# Patient Record
Sex: Male | Born: 1960 | Race: Black or African American | Hispanic: No | Marital: Single | Smoking: Current every day smoker
Health system: Southern US, Community
[De-identification: ages and names within clinical notes are randomized; demographics above are authoritative.]

## PROBLEM LIST (undated history)

## (undated) DIAGNOSIS — E119 Type 2 diabetes mellitus without complications: Secondary | ICD-10-CM

## (undated) DIAGNOSIS — I10 Essential (primary) hypertension: Secondary | ICD-10-CM

## (undated) HISTORY — PX: HERNIA REPAIR: SHX51

---

## 2017-03-07 ENCOUNTER — Emergency Department (HOSPITAL_COMMUNITY): Payer: Self-pay

## 2017-03-07 ENCOUNTER — Encounter (HOSPITAL_COMMUNITY): Payer: Self-pay | Admitting: Emergency Medicine

## 2017-03-07 ENCOUNTER — Emergency Department (HOSPITAL_COMMUNITY)
Admission: EM | Admit: 2017-03-07 | Discharge: 2017-03-08 | Disposition: A | Discharge status: Home / Self Care | Payer: Self-pay | Attending: Emergency Medicine | Admitting: Emergency Medicine

## 2017-03-07 DIAGNOSIS — Y939 Activity, unspecified: Secondary | ICD-10-CM | POA: Insufficient documentation

## 2017-03-07 DIAGNOSIS — S71012A Laceration without foreign body, left hip, initial encounter: Secondary | ICD-10-CM | POA: Insufficient documentation

## 2017-03-07 DIAGNOSIS — W3400XA Accidental discharge from unspecified firearms or gun, initial encounter: Secondary | ICD-10-CM | POA: Insufficient documentation

## 2017-03-07 DIAGNOSIS — Y929 Unspecified place or not applicable: Secondary | ICD-10-CM | POA: Insufficient documentation

## 2017-03-07 DIAGNOSIS — E119 Type 2 diabetes mellitus without complications: Secondary | ICD-10-CM | POA: Insufficient documentation

## 2017-03-07 DIAGNOSIS — I10 Essential (primary) hypertension: Secondary | ICD-10-CM | POA: Insufficient documentation

## 2017-03-07 DIAGNOSIS — Y999 Unspecified external cause status: Secondary | ICD-10-CM | POA: Insufficient documentation

## 2017-03-07 DIAGNOSIS — F17200 Nicotine dependence, unspecified, uncomplicated: Secondary | ICD-10-CM | POA: Insufficient documentation

## 2017-03-07 HISTORY — DX: Essential (primary) hypertension: I10

## 2017-03-07 HISTORY — DX: Type 2 diabetes mellitus without complications: E11.9

## 2017-03-07 LAB — I-STAT CHEM 8, ED
BUN: 21 mg/dL — ABNORMAL HIGH (ref 6–20)
CALCIUM ION: 1.14 mmol/L — AB (ref 1.15–1.40)
CREATININE: 1.6 mg/dL — AB (ref 0.61–1.24)
Chloride: 107 mmol/L (ref 101–111)
GLUCOSE: 201 mg/dL — AB (ref 65–99)
HCT: 36 % — ABNORMAL LOW (ref 39.0–52.0)
HEMOGLOBIN: 12.2 g/dL — AB (ref 13.0–17.0)
Potassium: 3.6 mmol/L (ref 3.5–5.1)
Sodium: 141 mmol/L (ref 135–145)
TCO2: 24 mmol/L (ref 22–32)

## 2017-03-07 MED ORDER — IOPAMIDOL (ISOVUE-300) INJECTION 61%
INTRAVENOUS | Status: AC
  Administered 2017-03-07: 100 mL
  Filled 2017-03-07: qty 100
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
## 2019-05-06 ENCOUNTER — Other Ambulatory Visit: Payer: Self-pay

## 2019-05-06 ENCOUNTER — Encounter (HOSPITAL_BASED_OUTPATIENT_CLINIC_OR_DEPARTMENT_OTHER): Payer: Self-pay | Admitting: Emergency Medicine

## 2019-05-06 ENCOUNTER — Emergency Department (HOSPITAL_BASED_OUTPATIENT_CLINIC_OR_DEPARTMENT_OTHER)
Admission: EM | Admit: 2019-05-06 | Discharge: 2019-05-06 | Disposition: A | Discharge status: Home / Self Care | Payer: Self-pay | Source: Home / Self Care | Attending: Emergency Medicine | Admitting: Emergency Medicine

## 2019-05-06 ENCOUNTER — Emergency Department (HOSPITAL_BASED_OUTPATIENT_CLINIC_OR_DEPARTMENT_OTHER): Payer: Self-pay

## 2019-05-06 ENCOUNTER — Emergency Department (HOSPITAL_BASED_OUTPATIENT_CLINIC_OR_DEPARTMENT_OTHER)
Admission: EM | Admit: 2019-05-06 | Discharge: 2019-05-06 | Disposition: A | Discharge status: Home / Self Care | Payer: Self-pay | Attending: Emergency Medicine | Admitting: Emergency Medicine

## 2019-05-06 DIAGNOSIS — E119 Type 2 diabetes mellitus without complications: Secondary | ICD-10-CM | POA: Insufficient documentation

## 2019-05-06 DIAGNOSIS — Z20822 Contact with and (suspected) exposure to covid-19: Secondary | ICD-10-CM | POA: Insufficient documentation

## 2019-05-06 DIAGNOSIS — E1122 Type 2 diabetes mellitus with diabetic chronic kidney disease: Secondary | ICD-10-CM

## 2019-05-06 DIAGNOSIS — N189 Chronic kidney disease, unspecified: Secondary | ICD-10-CM

## 2019-05-06 DIAGNOSIS — F17210 Nicotine dependence, cigarettes, uncomplicated: Secondary | ICD-10-CM | POA: Insufficient documentation

## 2019-05-06 DIAGNOSIS — I1310 Hypertensive heart and chronic kidney disease without heart failure, with stage 1 through stage 4 chronic kidney disease, or unspecified chronic kidney disease: Secondary | ICD-10-CM | POA: Insufficient documentation

## 2019-05-06 DIAGNOSIS — I10 Essential (primary) hypertension: Secondary | ICD-10-CM

## 2019-05-06 LAB — URINALYSIS, ROUTINE W REFLEX MICROSCOPIC
Bilirubin Urine: NEGATIVE
Glucose, UA: NEGATIVE mg/dL
Ketones, ur: 15 mg/dL — AB
Leukocytes,Ua: NEGATIVE
Nitrite: NEGATIVE
Protein, ur: >300 mg/dL — AB
Specific Gravity, Urine: >1.03 — ABNORMAL HIGH (ref 1.005–1.030)
pH: 5.5 (ref 5.0–8.0)

## 2019-05-06 LAB — CBC WITH DIFFERENTIAL/PLATELET
Abs Immature Granulocytes: 0.05 10*3/uL (ref 0.00–0.07)
Basophils Absolute: 0 10*3/uL (ref 0.0–0.1)
Basophils Relative: 0 %
Eosinophils Absolute: 0 10*3/uL (ref 0.0–0.5)
Eosinophils Relative: 0 %
HCT: 35.3 % — ABNORMAL LOW (ref 39.0–52.0)
Hemoglobin: 11.8 g/dL — ABNORMAL LOW (ref 13.0–17.0)
Immature Granulocytes: 1 %
Lymphocytes Relative: 10 %
Lymphs Abs: 0.9 10*3/uL (ref 0.7–4.0)
MCH: 32.3 pg (ref 26.0–34.0)
MCHC: 33.4 g/dL (ref 30.0–36.0)
MCV: 96.7 fL (ref 80.0–100.0)
Monocytes Absolute: 0.9 10*3/uL (ref 0.1–1.0)
Monocytes Relative: 9 %
Neutro Abs: 7.8 10*3/uL — ABNORMAL HIGH (ref 1.7–7.7)
Neutrophils Relative %: 80 %
Platelets: 185 10*3/uL (ref 150–400)
RBC: 3.65 MIL/uL — ABNORMAL LOW (ref 4.22–5.81)
RDW: 13.6 % (ref 11.5–15.5)
WBC: 9.7 10*3/uL (ref 4.0–10.5)
nRBC: 0 % (ref 0.0–0.2)

## 2019-05-06 LAB — COMPREHENSIVE METABOLIC PANEL
ALT: 39 U/L (ref 0–44)
AST: 75 U/L — ABNORMAL HIGH (ref 15–41)
Albumin: 3.7 g/dL (ref 3.5–5.0)
Alkaline Phosphatase: 48 U/L (ref 38–126)
Anion gap: 16 — ABNORMAL HIGH (ref 5–15)
BUN: 17 mg/dL (ref 8–23)
CO2: 22 mmol/L (ref 22–32)
Calcium: 9.2 mg/dL (ref 8.9–10.3)
Chloride: 96 mmol/L — ABNORMAL LOW (ref 98–111)
Creatinine, Ser: 2.15 mg/dL — ABNORMAL HIGH (ref 0.61–1.24)
GFR calc Af Amer: 37 mL/min — ABNORMAL LOW (ref >60)
GFR calc non Af Amer: 32 mL/min — ABNORMAL LOW (ref >60)
Glucose, Bld: 195 mg/dL — ABNORMAL HIGH (ref 70–99)
Potassium: 3.6 mmol/L (ref 3.5–5.1)
Sodium: 134 mmol/L — ABNORMAL LOW (ref 135–145)
Total Bilirubin: 0.8 mg/dL (ref 0.3–1.2)
Total Protein: 8.4 g/dL — ABNORMAL HIGH (ref 6.5–8.1)

## 2019-05-06 LAB — URINALYSIS, MICROSCOPIC (REFLEX)

## 2019-05-06 LAB — SARS CORONAVIRUS 2 (TAT 6-24 HRS): SARS Coronavirus 2: NEGATIVE

## 2019-05-06 LAB — LACTIC ACID, PLASMA: Lactic Acid, Venous: 1.8 mmol/L (ref 0.5–1.9)

## 2019-05-06 LAB — SARS CORONAVIRUS 2 AG (30 MIN TAT): SARS Coronavirus 2 Ag: NEGATIVE

## 2019-05-06 MED ORDER — LACTATED RINGERS IV BOLUS
1000.0000 mL | Freq: Once | INTRAVENOUS | Status: AC
  Administered 2019-05-06: 08:00:00 1000 mL via INTRAVENOUS

## 2019-05-06 MED ORDER — ACETAMINOPHEN 500 MG PO TABS
1000.0000 mg | ORAL_TABLET | Freq: Once | ORAL | Status: AC | PRN
  Administered 2019-05-06: 08:00:00 1000 mg via ORAL
  Filled 2019-05-06: qty 2

## 2019-05-06 NOTE — Progress Notes (Signed)
05/06/2019 12:34 pm TOC CM referral received to arrange appt with PCP. Attempted call to pt and left HIPAA compliant message for a return call. ED provider updated. Isidoro Donning RN CCM, [facility] ED TOC CM [phone]

## 2019-05-06 NOTE — ED Triage Notes (Signed)
Pt reports generalized weakness, fatigue, body aches, subjective fever since Tuesday.

## 2019-05-06 NOTE — ED Notes (Signed)
Pt reports he has not been taking BP meds and diabetes meds for years.

## 2019-05-06 NOTE — ED Provider Notes (Signed)
[place] EMERGENCY DEPARTMENT Provider Note   CSN: [id number] Arrival date & time: 05/06/19  0802     History Chief Complaint  Patient presents with  . Weakness    Brent Thompson is a 59 y.o. male.  The history is provided by the patient.  URI Presenting symptoms: congestion, cough, fatigue and fever   Severity:  Severe Onset quality:  Gradual Duration:  3 days Timing:  Constant Progression:  Worsening Chronicity:  New Relieved by:  Rest Worsened by:  Nothing Ineffective treatments:  None tried Associated symptoms: myalgias   Associated symptoms comment:  Abd cramping, diarrhea up to 8 times a day, no nausea or vomiting, poor appetite.   Risk factors: diabetes mellitus   Risk factors comment:  History of diabetes and hypertension but has been off meds for years and is not interested in taking medication.  He has not had Covid that he is aware of he has not had the vaccine and has had no recent contacts that are Covid positive      Past Medical History:  Diagnosis Date  . Diabetes mellitus without complication ([place])   . Hypertension     There are no problems to display for this patient.   Past Surgical History:  Procedure Laterality Date  . HERNIA REPAIR         No family history on file.  Social History   Tobacco Use  . Smoking status: Current Every Day Smoker    Packs/day: 1.00  . Smokeless tobacco: Never Used  Substance Use Topics  . Alcohol use: Yes    Comment: daily  . Drug use: No    Home Medications Prior to Admission medications   Not on File    Allergies    Patient has no known allergies.  Review of Systems   Review of Systems  Constitutional: Positive for fatigue and fever.  HENT: Positive for congestion.   Respiratory: Positive for cough.   Musculoskeletal: Positive for myalgias.  All other systems reviewed and are negative.   Physical Exam Updated Vital Signs BP (!) 222/138 (BP Location: Right Arm)   Pulse (!)  121   Temp (!) 103.1 F (39.5 C) (Oral)   Resp (!) 32   Ht 5\' 6"  (1.676 m)   Wt 72.6 kg   SpO2 97%   BMI 25.82 kg/m   Physical Exam Vitals and nursing note reviewed.  Constitutional:      General: He is not in acute distress.    Appearance: He is well-developed and normal weight.  HENT:     Head: Normocephalic and atraumatic.     Mouth/Throat:     Mouth: Mucous membranes are dry.  Eyes:     Conjunctiva/sclera: Conjunctivae normal.     Pupils: Pupils are equal, round, and reactive to light.  Cardiovascular:     Rate and Rhythm: Regular rhythm. Tachycardia present.     Pulses: Normal pulses.     Heart sounds: No murmur.  Pulmonary:     Effort: Pulmonary effort is normal. No respiratory distress.     Breath sounds: Normal breath sounds. No wheezing or rales.  Abdominal:     General: There is no distension.     Palpations: Abdomen is soft.     Tenderness: There is no abdominal tenderness. There is no guarding or rebound.  Musculoskeletal:        General: No tenderness. Normal range of motion.     Cervical back: Normal range of motion and  neck supple.     Right lower leg: No edema.     Left lower leg: No edema.  Skin:    General: Skin is warm and dry.     Findings: No erythema or rash.  Neurological:     General: No focal deficit present.     Mental Status: He is alert and oriented to person, place, and time. Mental status is at baseline.  Psychiatric:        Mood and Affect: Mood normal.        Behavior: Behavior normal.        Thought Content: Thought content normal.      ED Results / Procedures / Treatments   Labs (all labs ordered are listed, but only abnormal results are displayed) Labs Reviewed  COMPREHENSIVE METABOLIC PANEL - Abnormal; Notable for the following components:      Result Value   Sodium 134 (*)    Chloride 96 (*)    Glucose, Bld 195 (*)    Creatinine, Ser 2.15 (*)    Total Protein 8.4 (*)    AST 75 (*)    GFR calc non Af Amer 32 (*)     GFR calc Af Amer 37 (*)    Anion gap 16 (*)    All other components within normal limits  CBC WITH DIFFERENTIAL/PLATELET - Abnormal; Notable for the following components:   RBC 3.65 (*)    Hemoglobin 11.8 (*)    HCT 35.3 (*)    Neutro Abs 7.8 (*)    All other components within normal limits  SARS CORONAVIRUS 2 AG (30 MIN TAT)  LACTIC ACID, PLASMA  LACTIC ACID, PLASMA  URINALYSIS, ROUTINE W REFLEX MICROSCOPIC    EKG EKG Interpretation  Date/Time:  Thursday May 06 2019 08:16:41 EDT Ventricular Rate:  121 PR Interval:    QRS Duration: 94 QT Interval:  340 QTC Calculation: 483 R Axis:   77 Text Interpretation: Sinus tachycardia LAE, consider biatrial enlargement Probable left ventricular hypertrophy Borderline prolonged QT interval No significant change since last tracing Confirmed by Gwyneth Sprout ([id number]) on 05/06/2019 8:25:50 AM   Radiology DG Chest Portable 1 View  Result Date: 05/06/2019 CLINICAL DATA:  Fever EXAM: PORTABLE CHEST 1 VIEW COMPARISON:  None. FINDINGS: Normal heart size and mediastinal contours. No acute infiltrate or edema. No effusion or pneumothorax. No acute osseous findings. IMPRESSION: Negative chest. Electronically Signed   By: Marnee Spring M.D.   On: 05/06/2019 08:53    Procedures Procedures (including critical care time)  Medications Ordered in ED Medications  lactated ringers bolus 1,000 mL (has no administration in time range)  acetaminophen (TYLENOL) tablet 1,000 mg (1,000 mg Oral Given 05/06/19 0825)    ED Course  I have reviewed the triage vital signs and the nursing notes.  Pertinent labs & imaging results that were available during my care of the patient were reviewed by me and considered in my medical decision making (see chart for details).    MDM Rules/Calculators/A&P                      Patient is a 59 year old male presenting today with viral syndrome with fever, myalgias, cough and diarrhea.  Patient denies any shortness of  breath or chest pain.  Patient is noted here to be extremely hypertensive and tachycardic with a fever of 103.1.  Patient reports that he has a known history of hypertension but has not taken medication for at least 2 or  3 years and is not interested in taking medications.  He denies any chest pain or vomiting.  He has no localized abdominal pain concerning for an acute abdominal process.  Suspect most likely Covid or other viral process.  Given patient's poor medical follow-up will check to ensure he has reasonable kidney function and stable labs.  Chest x-ray is pending.  Patient given IV fluids and Tylenol for fever.  Covid test pending.  EKG shows sinus tachycardia with LVH which is not significantly changed from prior tracings.  We will continue to follow blood pressure.  9:12 AM Rapid Covid is negative and will send 6 to 12-hour testing, lactic acid within normal limits, CMP with hyperglycemia of 195 and creatinine of 2.15 which last creatinine that was done was 2 years ago it was 1.6.  This may be his baseline from chronic hypertension it is unknown.  BUN is normal.  Mild anion gap of 16.  Chest x-ray within normal limits and CBC without acute findings.  After Tylenol and IV fluids patient's heart rate has improved to the upper 90's and low 100s with oxygen saturation remaining 98% and blood pressure 192/111. Also patient does not have insurance and will consult transitional care team for help with establishing an appointment with a PCP.  At this time patient does not require any further intervention and is safe for discharge.  MDM Number of Diagnoses or Management Options   Amount and/or Complexity of Data Reviewed Clinical lab tests: ordered and reviewed Tests in the radiology section of CPT: ordered and reviewed Tests in the medicine section of CPT: ordered and reviewed Decide to obtain previous medical records or to obtain history from someone other than the patient: yes Obtain history  from someone other than the patient: no Review and summarize past medical records: yes Independent visualization of images, tracings, or specimens: yes  Risk of Complications, Morbidity, and/or Mortality Presenting problems: moderate Diagnostic procedures: minimal Management options: minimal  Patient Progress Patient progress: improved   Brent Thompson was evaluated in Emergency Department on 05/06/2019 for the symptoms described in the history of present illness. He was evaluated in the context of the global COVID-19 pandemic, which necessitated consideration that the patient might be at risk for infection with the SARS-CoV-2 virus that causes COVID-19. Institutional protocols and algorithms that pertain to the evaluation of patients at risk for COVID-19 are in a state of rapid change based on information released by regulatory bodies including the CDC and federal and state organizations. These policies and algorithms were followed during the patient's care in the ED.  Final Clinical Impression(s) / ED Diagnoses Final diagnoses:  Suspected COVID-19 virus infection  Hypertension, unspecified type  Chronic kidney disease, unspecified CKD stage    Rx / DC Orders ED Discharge Orders    None       Gwyneth Sprout, MD 05/06/19 [phone]

## 2019-05-10 ENCOUNTER — Emergency Department (HOSPITAL_BASED_OUTPATIENT_CLINIC_OR_DEPARTMENT_OTHER): Payer: Self-pay

## 2019-05-10 ENCOUNTER — Inpatient Hospital Stay (HOSPITAL_BASED_OUTPATIENT_CLINIC_OR_DEPARTMENT_OTHER)
Admission: EM | Admit: 2019-05-10 | Discharge: 2019-05-13 | DRG: 871 | Disposition: A | Discharge status: Home / Self Care | Payer: Self-pay | Attending: Internal Medicine | Admitting: Internal Medicine

## 2019-05-10 ENCOUNTER — Other Ambulatory Visit: Payer: Self-pay

## 2019-05-10 ENCOUNTER — Encounter (HOSPITAL_BASED_OUTPATIENT_CLINIC_OR_DEPARTMENT_OTHER): Payer: Self-pay

## 2019-05-10 DIAGNOSIS — N183 Chronic kidney disease, stage 3 unspecified: Secondary | ICD-10-CM

## 2019-05-10 DIAGNOSIS — E872 Acidosis: Secondary | ICD-10-CM | POA: Diagnosis present

## 2019-05-10 DIAGNOSIS — R6520 Severe sepsis without septic shock: Secondary | ICD-10-CM | POA: Diagnosis present

## 2019-05-10 DIAGNOSIS — I10 Essential (primary) hypertension: Secondary | ICD-10-CM

## 2019-05-10 DIAGNOSIS — E119 Type 2 diabetes mellitus without complications: Secondary | ICD-10-CM

## 2019-05-10 DIAGNOSIS — F17210 Nicotine dependence, cigarettes, uncomplicated: Secondary | ICD-10-CM | POA: Diagnosis present

## 2019-05-10 DIAGNOSIS — A419 Sepsis, unspecified organism: Principal | ICD-10-CM | POA: Diagnosis present

## 2019-05-10 DIAGNOSIS — J189 Pneumonia, unspecified organism: Secondary | ICD-10-CM | POA: Diagnosis present

## 2019-05-10 DIAGNOSIS — E871 Hypo-osmolality and hyponatremia: Secondary | ICD-10-CM | POA: Diagnosis present

## 2019-05-10 DIAGNOSIS — Z20822 Contact with and (suspected) exposure to covid-19: Secondary | ICD-10-CM | POA: Diagnosis present

## 2019-05-10 DIAGNOSIS — E1122 Type 2 diabetes mellitus with diabetic chronic kidney disease: Secondary | ICD-10-CM

## 2019-05-10 DIAGNOSIS — N179 Acute kidney failure, unspecified: Secondary | ICD-10-CM

## 2019-05-10 DIAGNOSIS — N1832 Chronic kidney disease, stage 3b: Secondary | ICD-10-CM | POA: Diagnosis present

## 2019-05-10 DIAGNOSIS — E1165 Type 2 diabetes mellitus with hyperglycemia: Secondary | ICD-10-CM | POA: Diagnosis present

## 2019-05-10 DIAGNOSIS — E876 Hypokalemia: Secondary | ICD-10-CM

## 2019-05-10 DIAGNOSIS — R739 Hyperglycemia, unspecified: Secondary | ICD-10-CM

## 2019-05-10 DIAGNOSIS — E878 Other disorders of electrolyte and fluid balance, not elsewhere classified: Secondary | ICD-10-CM | POA: Diagnosis present

## 2019-05-10 HISTORY — DX: Type 2 diabetes mellitus without complications: E11.9

## 2019-05-10 HISTORY — DX: Essential (primary) hypertension: I10

## 2019-05-10 LAB — COMPREHENSIVE METABOLIC PANEL
ALT: 85 U/L — ABNORMAL HIGH (ref 0–44)
AST: 170 U/L — ABNORMAL HIGH (ref 15–41)
Albumin: 3.3 g/dL — ABNORMAL LOW (ref 3.5–5.0)
Alkaline Phosphatase: 68 U/L (ref 38–126)
Anion gap: 17 — ABNORMAL HIGH (ref 5–15)
BUN: 28 mg/dL — ABNORMAL HIGH (ref 8–23)
CO2: 22 mmol/L (ref 22–32)
Calcium: 9.3 mg/dL (ref 8.9–10.3)
Chloride: 90 mmol/L — ABNORMAL LOW (ref 98–111)
Creatinine, Ser: 2.62 mg/dL — ABNORMAL HIGH (ref 0.61–1.24)
GFR calc Af Amer: 29 mL/min — ABNORMAL LOW (ref >60)
GFR calc non Af Amer: 25 mL/min — ABNORMAL LOW (ref >60)
Glucose, Bld: 337 mg/dL — ABNORMAL HIGH (ref 70–99)
Potassium: 3.2 mmol/L — ABNORMAL LOW (ref 3.5–5.1)
Sodium: 129 mmol/L — ABNORMAL LOW (ref 135–145)
Total Bilirubin: 0.9 mg/dL (ref 0.3–1.2)
Total Protein: 8.5 g/dL — ABNORMAL HIGH (ref 6.5–8.1)

## 2019-05-10 LAB — URINALYSIS, ROUTINE W REFLEX MICROSCOPIC
Bilirubin Urine: NEGATIVE
Glucose, UA: >=500 mg/dL — AB
Ketones, ur: 15 mg/dL — AB
Leukocytes,Ua: NEGATIVE
Nitrite: NEGATIVE
Protein, ur: >300 mg/dL — AB
Specific Gravity, Urine: >1.03 — ABNORMAL HIGH (ref 1.005–1.030)
pH: 6 (ref 5.0–8.0)

## 2019-05-10 LAB — URINALYSIS, MICROSCOPIC (REFLEX)

## 2019-05-10 LAB — CBC WITH DIFFERENTIAL/PLATELET
Abs Immature Granulocytes: 0.11 10*3/uL — ABNORMAL HIGH (ref 0.00–0.07)
Basophils Absolute: 0 10*3/uL (ref 0.0–0.1)
Basophils Relative: 0 %
Eosinophils Absolute: 0 10*3/uL (ref 0.0–0.5)
Eosinophils Relative: 0 %
HCT: 34.8 % — ABNORMAL LOW (ref 39.0–52.0)
Hemoglobin: 11.8 g/dL — ABNORMAL LOW (ref 13.0–17.0)
Immature Granulocytes: 1 %
Lymphocytes Relative: 6 %
Lymphs Abs: 0.6 10*3/uL — ABNORMAL LOW (ref 0.7–4.0)
MCH: 32.1 pg (ref 26.0–34.0)
MCHC: 33.9 g/dL (ref 30.0–36.0)
MCV: 94.6 fL (ref 80.0–100.0)
Monocytes Absolute: 0.7 10*3/uL (ref 0.1–1.0)
Monocytes Relative: 7 %
Neutro Abs: 8.1 10*3/uL — ABNORMAL HIGH (ref 1.7–7.7)
Neutrophils Relative %: 86 %
Platelets: 197 10*3/uL (ref 150–400)
RBC: 3.68 MIL/uL — ABNORMAL LOW (ref 4.22–5.81)
RDW: 14 % (ref 11.5–15.5)
WBC: 9.4 10*3/uL (ref 4.0–10.5)
nRBC: 0 % (ref 0.0–0.2)

## 2019-05-10 LAB — LACTIC ACID, PLASMA
Lactic Acid, Venous: 2.5 mmol/L (ref 0.5–1.9)
Lactic Acid, Venous: 1.5 mmol/L (ref 0.5–1.9)

## 2019-05-10 LAB — HIV ANTIBODY (ROUTINE TESTING W REFLEX): HIV Screen 4th Generation wRfx: NONREACTIVE

## 2019-05-10 LAB — APTT: aPTT: 41 seconds — ABNORMAL HIGH (ref 24–36)

## 2019-05-10 LAB — SARS CORONAVIRUS 2 (TAT 6-24 HRS): SARS Coronavirus 2: NEGATIVE

## 2019-05-10 LAB — PROTIME-INR
INR: 1 (ref 0.8–1.2)
Prothrombin Time: 13.3 seconds (ref 11.4–15.2)

## 2019-05-10 MED ORDER — POTASSIUM CHLORIDE CRYS ER 20 MEQ PO TBCR
40.0000 meq | EXTENDED_RELEASE_TABLET | ORAL | Status: AC
  Administered 2019-05-10 – 2019-05-11 (×2): 40 meq via ORAL
  Filled 2019-05-10 (×2): qty 2

## 2019-05-10 MED ORDER — SODIUM CHLORIDE 0.9 % IV SOLN: INTRAVENOUS | Status: DC

## 2019-05-10 MED ORDER — PANTOPRAZOLE SODIUM 40 MG PO TBEC
40.0000 mg | DELAYED_RELEASE_TABLET | Freq: Every day | ORAL | Status: DC
  Administered 2019-05-10 – 2019-05-13 (×4): 40 mg via ORAL
  Filled 2019-05-10 (×4): qty 1

## 2019-05-10 MED ORDER — POLYETHYLENE GLYCOL 3350 17 G PO PACK
17.0000 g | PACK | Freq: Two times a day (BID) | ORAL | Status: DC
  Administered 2019-05-10 – 2019-05-13 (×5): 17 g via ORAL
  Filled 2019-05-10 (×6): qty 1

## 2019-05-10 MED ORDER — SODIUM CHLORIDE 0.9 % IV SOLN
2.0000 g | Freq: Once | INTRAVENOUS | Status: AC
  Administered 2019-05-10: 2 g via INTRAVENOUS
  Filled 2019-05-10: qty 20

## 2019-05-10 MED ORDER — ACETAMINOPHEN 500 MG PO TABS
1000.0000 mg | ORAL_TABLET | Freq: Once | ORAL | Status: AC
  Administered 2019-05-10: 1000 mg via ORAL
  Filled 2019-05-10: qty 2

## 2019-05-10 MED ORDER — SODIUM CHLORIDE 0.9 % IV SOLN
500.0000 mg | Freq: Once | INTRAVENOUS | Status: AC
  Administered 2019-05-10: 500 mg via INTRAVENOUS
  Filled 2019-05-10: qty 500

## 2019-05-10 MED ORDER — BISACODYL 5 MG PO TBEC
10.0000 mg | DELAYED_RELEASE_TABLET | Freq: Once | ORAL | Status: AC
  Administered 2019-05-10: 10 mg via ORAL
  Filled 2019-05-10: qty 2

## 2019-05-10 MED ORDER — ONDANSETRON HCL 4 MG PO TABS: 4.0000 mg | ORAL_TABLET | Freq: Four times a day (QID) | ORAL | Status: DC | PRN

## 2019-05-10 MED ORDER — AZITHROMYCIN 250 MG PO TABS
500.0000 mg | ORAL_TABLET | Freq: Every day | ORAL | Status: DC
  Administered 2019-05-11 – 2019-05-13 (×3): 500 mg via ORAL
  Filled 2019-05-10 (×3): qty 2

## 2019-05-10 MED ORDER — ONDANSETRON HCL 4 MG/2ML IJ SOLN: 4.0000 mg | Freq: Four times a day (QID) | INTRAMUSCULAR | Status: DC | PRN

## 2019-05-10 MED ORDER — ACETAMINOPHEN 650 MG RE SUPP: 650.0000 mg | Freq: Four times a day (QID) | RECTAL | Status: DC | PRN

## 2019-05-10 MED ORDER — METOPROLOL TARTRATE 5 MG/5ML IV SOLN
5.0000 mg | INTRAVENOUS | Status: DC | PRN
  Administered 2019-05-10: 5 mg via INTRAVENOUS
  Filled 2019-05-10: qty 5

## 2019-05-10 MED ORDER — SODIUM CHLORIDE 0.9 % IV BOLUS
500.0000 mL | Freq: Once | INTRAVENOUS | Status: AC
  Administered 2019-05-10: 500 mL via INTRAVENOUS

## 2019-05-10 MED ORDER — METRONIDAZOLE IN NACL 5-0.79 MG/ML-% IV SOLN
500.0000 mg | Freq: Once | INTRAVENOUS | Status: AC
  Administered 2019-05-10: 500 mg via INTRAVENOUS
  Filled 2019-05-10: qty 100

## 2019-05-10 MED ORDER — ACETAMINOPHEN 325 MG PO TABS
650.0000 mg | ORAL_TABLET | Freq: Four times a day (QID) | ORAL | Status: DC | PRN
  Administered 2019-05-10 – 2019-05-12 (×3): 650 mg via ORAL
  Filled 2019-05-10 (×3): qty 2

## 2019-05-10 MED ORDER — ENOXAPARIN SODIUM 40 MG/0.4ML ~~LOC~~ SOLN: 40.0000 mg | SUBCUTANEOUS | Status: DC

## 2019-05-10 MED ORDER — SODIUM CHLORIDE 0.9 % IV BOLUS
1000.0000 mL | Freq: Once | INTRAVENOUS | Status: AC
  Administered 2019-05-10: 1000 mL via INTRAVENOUS

## 2019-05-10 MED ORDER — HYDRALAZINE HCL 20 MG/ML IJ SOLN
10.0000 mg | INTRAMUSCULAR | Status: DC | PRN
  Administered 2019-05-10 – 2019-05-11 (×2): 10 mg via INTRAVENOUS
  Filled 2019-05-10 (×2): qty 1

## 2019-05-10 MED ORDER — MORPHINE SULFATE (PF) 2 MG/ML IV SOLN: 1.0000 mg | INTRAVENOUS | Status: DC | PRN

## 2019-05-10 MED ORDER — AMLODIPINE BESYLATE 10 MG PO TABS
10.0000 mg | ORAL_TABLET | Freq: Once | ORAL | Status: AC
  Administered 2019-05-10: 10 mg via ORAL
  Filled 2019-05-10: qty 1

## 2019-05-10 MED ORDER — GUAIFENESIN-DM 100-10 MG/5ML PO SYRP: 5.0000 mL | ORAL_SOLUTION | ORAL | Status: DC | PRN

## 2019-05-10 MED ORDER — IPRATROPIUM-ALBUTEROL 0.5-2.5 (3) MG/3ML IN SOLN: 3.0000 mL | RESPIRATORY_TRACT | Status: DC | PRN

## 2019-05-10 MED ORDER — INSULIN ASPART 100 UNIT/ML ~~LOC~~ SOLN
0.0000 [IU] | Freq: Three times a day (TID) | SUBCUTANEOUS | Status: DC
  Administered 2019-05-11: 8 [IU] via SUBCUTANEOUS
  Administered 2019-05-12: 3 [IU] via SUBCUTANEOUS

## 2019-05-10 MED ORDER — SODIUM CHLORIDE 0.9 % IV SOLN
1.0000 g | INTRAVENOUS | Status: DC
  Administered 2019-05-11 – 2019-05-13 (×3): 1 g via INTRAVENOUS
  Filled 2019-05-10 (×3): qty 1

## 2019-05-10 NOTE — Progress Notes (Signed)
   05/10/19 2106  Assess: MEWS Score  Temp 100.1 F (37.8 C)  BP 134/81  Pulse Rate 88  Resp 20  Level of Consciousness Alert  SpO2 94 %  O2 Device Room Air  Assess: MEWS Score  MEWS Temp 0  MEWS Systolic 0  MEWS Pulse 0  MEWS RR 0  MEWS LOC 0  MEWS Score 0  MEWS Score Color Green  patient has been Green MEWS since 2106 after intervention

## 2019-05-10 NOTE — ED Triage Notes (Signed)
Pt arrives with reports of constipation with last BM "last week sometime". Pt reports being seen here recently for "just feeling bad" states that he is still "Feeling bad".

## 2019-05-10 NOTE — ED Notes (Signed)
Pt to ct 

## 2019-05-10 NOTE — Progress Notes (Signed)
RED MEWS initatied.   VS  T 100.5 BP 212/130 HR 112 RR 28 O2 96% RA  Notified charge nurse Christena Deem. Christena Deem, RN notified Dr. Ella Jubilee by [facility] page and spoke w/ him. Dr. Ella Jubilee ordered Amlodipine and transfer to step down.  Christena Deem, RN called rapid response nurse Zoe. Zoe informed her to call [facility], RN. Gave PRN hydralazine. BP came down to 189/114 and then 187/107. Patient is alert.

## 2019-05-10 NOTE — Progress Notes (Signed)
   05/10/19 1844  Assess: MEWS Score  Temp (!) 100.5 F (38.1 C)  BP (!) 212/130  Pulse Rate (!) 112  Resp (!) 28  SpO2 96 %  O2 Device Room Air  Assess: MEWS Score  MEWS Temp 1  MEWS Systolic 2  MEWS Pulse 2  MEWS RR 2  MEWS LOC 0  MEWS Score 7  MEWS Score Color Red  Assess: if the MEWS score is Yellow or Red  Were vital signs taken at a resting state? Yes  Early Detection of Sepsis Score *See Row Information* High  MEWS guidelines implemented *See Row Information* Yes  Treat  MEWS Interventions Administered scheduled meds/treatments;Administered prn meds/treatments  Take Vital Signs  Increase Vital Sign Frequency  Red: Q 1hr X 4 then Q 4hr X 4, if remains red, continue Q 4hrs  Escalate  MEWS: Escalate Red: discuss with charge nurse/RN and provider, consider discussing with RRT  Notify: Charge Nurse/RN  Name of Charge Nurse/RN Notified Christena Deem  Date Charge Nurse/RN Notified 05/10/19  Time Charge Nurse/RN Notified 1841  Notify: Provider  Provider Name/Title Dr. Ella Jubilee  Date Provider Notified 05/10/19  Time Provider Notified 1843  Notification Type Page Dahlia Client notified by [facility])  Notification Reason Change in status  Response See new orders  Date of Provider Response 05/10/19  Time of Provider Response 1847  Notify: Rapid Response  Name of Rapid Response RN Notified Zoey Dahlia Client notified )  Date Rapid Response Notified 05/10/19  Time Rapid Response Notified 1843  Document  Patient Outcome Transferred/level of care increased (orders and bed request placed)  Progress note created (see row info) Yes    Levora Angel, RN

## 2019-05-10 NOTE — ED Provider Notes (Signed)
[facility] EMERGENCY DEPARTMENT Provider Note   CSN: [id number] Arrival date & time: 05/10/19  8101     History Chief Complaint  Patient presents with  . Constipation    Brent Thompson is a 59 y.o. male presenting for evaluation of abdominal pain, constipation, and "not feeling well."  Patient states he has not felt well for a week.  He states he developed abdominal pain last week.  He was then seen in the ER, had a reassuring work-up, and was discharged with pain medicine.  Since then, he has been unable to have a bowel movement.  He reports worsening abdominal pain.  He does not know if he has had any fevers.  He reports a chronic cough, unsure if it has changed.  He denies nausea or vomiting, but states he has not been eating or drinking due to decreased appetite.  He denies change in urination.  Patient initially told me he has not taken any of his blood pressure or diabetes medicines in a week, but later told the nurse that has been years since he has taken these medications.   Additional history obtained from chart review.  Reviewed ER visit from last week.  HPI     Past Medical History:  Diagnosis Date  . Diabetes mellitus without complication (HCC)   . Hypertension     Patient Active Problem List   Diagnosis Date Noted  . Sepsis (HCC) 05/10/2019    History reviewed. No pertinent surgical history.     No family history on file.  Social History   Tobacco Use  . Smoking status: Current Every Day Smoker    Packs/day: 1.00    Types: Cigarettes  Substance Use Topics  . Alcohol use: Yes  . Drug use: Never    Home Medications Prior to Admission medications   Not on File    Allergies    Patient has no known allergies.  Review of Systems   Review of Systems  Constitutional: Positive for appetite change and fatigue.  Gastrointestinal: Positive for abdominal pain and constipation.  All other systems reviewed and are negative.   Physical Exam  Updated Vital Signs BP 137/84 (BP Location: Right Arm)   Pulse 93   Temp 99.8 F (37.7 C) (Oral)   Resp (!) 22   Ht 5\' 6"  (1.676 m)   Wt 72.6 kg   SpO2 97%   BMI 25.82 kg/m   Physical Exam Vitals and nursing note reviewed.  Constitutional:      General: He is not in acute distress.    Appearance: He is well-developed.     Comments: In NAD  HENT:     Head: Normocephalic and atraumatic.  Eyes:     Extraocular Movements: Extraocular movements intact.     Conjunctiva/sclera: Conjunctivae normal.     Pupils: Pupils are equal, round, and reactive to light.  Cardiovascular:     Rate and Rhythm: Regular rhythm. Tachycardia present.     Pulses: Normal pulses.     Comments: Tachycardic around 120 Pulmonary:     Effort: Pulmonary effort is normal. No respiratory distress.     Breath sounds: No wheezing.     Comments: Cough noted multiple times throughout exam.  Tachypneic.  Speaking in full sentences. Abdominal:     General: There is no distension.     Palpations: Abdomen is soft. There is no mass.     Tenderness: There is no abdominal tenderness. There is no guarding or rebound.  Comments: No tenderness to palpation of the abdomen.  No rigidity, guarding, distention.  Negative rebound.  No peritonitis.  Musculoskeletal:        General: Normal range of motion.     Cervical back: Normal range of motion and neck supple.  Skin:    General: Skin is warm and dry.     Capillary Refill: Capillary refill takes less than 2 seconds.  Neurological:     Mental Status: He is alert and oriented to person, place, and time.     ED Results / Procedures / Treatments   Labs (all labs ordered are listed, but only abnormal results are displayed) Labs Reviewed  LACTIC ACID, PLASMA - Abnormal; Notable for the following components:      Result Value   Lactic Acid, Venous 2.5 (*)    All other components within normal limits  COMPREHENSIVE METABOLIC PANEL - Abnormal; Notable for the following  components:   Sodium 129 (*)    Potassium 3.2 (*)    Chloride 90 (*)    Glucose, Bld 337 (*)    BUN 28 (*)    Creatinine, Ser 2.62 (*)    Total Protein 8.5 (*)    Albumin 3.3 (*)    AST 170 (*)    ALT 85 (*)    GFR calc non Af Amer 25 (*)    GFR calc Af Amer 29 (*)    Anion gap 17 (*)    All other components within normal limits  CBC WITH DIFFERENTIAL/PLATELET - Abnormal; Notable for the following components:   RBC 3.68 (*)    Hemoglobin 11.8 (*)    HCT 34.8 (*)    Neutro Abs 8.1 (*)    Lymphs Abs 0.6 (*)    Abs Immature Granulocytes 0.11 (*)    All other components within normal limits  APTT - Abnormal; Notable for the following components:   aPTT 41 (*)    All other components within normal limits  URINALYSIS, ROUTINE W REFLEX MICROSCOPIC - Abnormal; Notable for the following components:   APPearance CLOUDY (*)    Specific Gravity, Urine >1.030 (*)    Glucose, UA >=500 (*)    Hgb urine dipstick LARGE (*)    Ketones, ur 15 (*)    Protein, ur >300 (*)    All other components within normal limits  URINALYSIS, MICROSCOPIC (REFLEX) - Abnormal; Notable for the following components:   Bacteria, UA RARE (*)    All other components within normal limits  CULTURE, BLOOD (ROUTINE X 2)  CULTURE, BLOOD (ROUTINE X 2)  URINE CULTURE  SARS CORONAVIRUS 2 (TAT 6-24 HRS)  LACTIC ACID, PLASMA  PROTIME-INR    EKG EKG Interpretation  Date/Time:  Monday May 10 2019 10:11:29 EDT Ventricular Rate:  128 PR Interval:    QRS Duration: 97 QT Interval:  328 QTC Calculation: 479 R Axis:   50 Text Interpretation: Sinus tachycardia LAE, consider biatrial enlargement Left ventricular hypertrophy Borderline prolonged QT interval Baseline wander in lead(s) I III aVL V1 No old tracing to compare Confirmed by Linwood Dibbles [phone]) on 05/10/2019 10:30:07 AM   Radiology CT ABDOMEN PELVIS WO CONTRAST  Result Date: 05/10/2019 CLINICAL DATA:  Abdominal pain, constipation, nausea and fevers. EXAM: CT  ABDOMEN AND PELVIS WITHOUT CONTRAST TECHNIQUE: Multidetector CT imaging of the abdomen and pelvis was performed following the standard protocol without IV contrast. COMPARISON:  03/07/2017 FINDINGS: Lower chest: Ground-glass attenuation and peribronchovascular consolidation noted involving much of the visualized portions of the  right lower lobe concerning for pneumonia. Hepatobiliary: No focal liver abnormality is seen. No gallstones, gallbladder wall thickening, or biliary dilatation. Pancreas: Unremarkable. No pancreatic ductal dilatation or surrounding inflammatory changes. Spleen: Normal in size without focal abnormality. Adrenals/Urinary Tract: Normal appearance of the adrenal glands. Tiny, punctate stones identified in both kidneys, nonspecific. There is bilateral perinephric soft tissue stranding, new from previous exam. No hydronephrosis. Left kidney cyst measures 1.7 cm and is too small to characterize, image 23/2. The urinary bladder appears unremarkable. Stomach/Bowel: Stomach is within normal limits. The appendix is not confidently identified separate from the right lower quadrant bowel loops. No evidence of bowel wall thickening, distention, or inflammatory changes. Vascular/Lymphatic: Mild aortic atherosclerosis. There is no aneurysm. No abdominopelvic adenopathy identified. No pelvic or inguinal adenopathy. Reproductive: Prostate is unremarkable. Other: No free fluid or fluid collections. Small fat containing umbilical hernia. Musculoskeletal: Scoliosis and degenerative disc disease. IMPRESSION: 1. Ground-glass attenuation and peribronchovascular densities noted involving much of the visualized portions of the right lower lobe concerning for pneumonia. 2. Bilateral perinephric soft tissue stranding is new from previous exam nonspecific. Correlate for any clinical signs or symptoms of pyelonephritis. 3. Tiny, punctate stones are identified in both kidneys no hydronephrosis, hydroureter or ureteral  calculi. 4. Aortic atherosclerosis. Aortic Atherosclerosis (ICD10-I70.0). Electronically Signed   By: Signa Kell M.D.   On: 05/10/2019 12:04   DG Chest Port 1 View  Result Date: 05/10/2019 CLINICAL DATA:  Fever. EXAM: PORTABLE CHEST 1 VIEW COMPARISON:  May 06, 2019. FINDINGS: The heart size and mediastinal contours are within normal limits. No pneumothorax or pleural effusion is noted. Left lung is clear. Mild right basilar opacity is noted concerning for pneumonia. The visualized skeletal structures are unremarkable. IMPRESSION: Mild right basilar opacity is noted concerning for pneumonia. Followup radiographs are recommended until resolution. Electronically Signed   By: Lupita Raider M.D.   On: 05/10/2019 11:56    Procedures .Critical Care Performed by: Alveria Apley, PA-C Authorized by: Alveria Apley, PA-C   Critical care provider statement:    Critical care time (minutes):  50   Critical care time was exclusive of:  Separately billable procedures and treating other patients and teaching time   Critical care was necessary to treat or prevent imminent or life-threatening deterioration of the following conditions:  Sepsis   Critical care was time spent personally by me on the following activities:  Blood draw for specimens, development of treatment plan with patient or surrogate, evaluation of patient's response to treatment, examination of patient, obtaining history from patient or surrogate, ordering and performing treatments and interventions, ordering and review of laboratory studies, ordering and review of radiographic studies, pulse oximetry, re-evaluation of patient's condition and review of old charts   I assumed direction of critical care for this patient from another provider in my specialty: no   Comments:     Patient presents to the ER meeting SIRS criteria.  IV antibiotics started, patient to be admitted to the hospital for further evaluation management.   (including  critical care time)  Medications Ordered in ED Medications  cefTRIAXone (ROCEPHIN) 2 g in sodium chloride 0.9 % 100 mL IVPB ( Intravenous Stopped 05/10/19 1223)  metroNIDAZOLE (FLAGYL) IVPB 500 mg (0 mg Intravenous Stopped 05/10/19 1329)  acetaminophen (TYLENOL) tablet 1,000 mg (1,000 mg Oral Given 05/10/19 1142)  sodium chloride 0.9 % bolus 1,000 mL (0 mLs Intravenous Stopped 05/10/19 1253)  azithromycin (ZITHROMAX) 500 mg in sodium chloride 0.9 % 250 mL IVPB (0 mg  Intravenous Stopped 05/10/19 1437)    ED Course  I have reviewed the triage vital signs and the nursing notes.  Pertinent labs & imaging results that were available during my care of the patient were reviewed by me and considered in my medical decision making (see chart for details).    MDM Rules/Calculators/A&P                      Patient presenting for evaluation of abdominal pain and constipation.  Additionally, states he is just not feeling well.  Upon arrival, patient meet SIRS criteria, as he is febrile, tachycardic, tachypneic.  I am concerned about his continued fever and symptoms for a week.  He had a negative PCR Covid test, as such lower suspicion for this.  Will obtain sepsis labs, chest x-ray, and CT of the abdomen for further evaluation.  We will treat with intra-abdominal antibiotics, fluids, and Tylenol.  Labs interpreted by me, slightly worsened from last week.  No previous to compare beside last week.  Glucose is elevated at 337, but no signs of DKA.  As such, he likely has pseudohyponatremia with a sodium of 129.  Potassium mildly low at 3.2, likely due to decreased p.o. intake.  Creatinine elevated at 2.6, this is elevated from last week.  Unknown if this is new or chronic.  LFTs mildly elevated at 170 and 85.  No leukocytosis.  Lactic 2.5.  Chest x-ray viewed interpreted by me, shows opacity the right lower lobe.  This is concerning for pneumonia.  CT abdomen pelvis to be done without contrast due to patient's  kidney function.  It shows bilateral perinephric stranding and once again demonstrates right lower lobe ground glass opacity.  Patient's urine is pending, consider Pyelo.  Urine without signs of infection, although significant proteinuria with protein greater than 300.  As such, favor pneumonia over pyelo as cause of infection.  Will add azithromycin to antibiotics.  Case discussed with attending, Dr. Tomi Bamberger evaluated the patient.  Will call for admission.  Discussed with Dr. Andria Frames from [facility] hospitalist service, [facility].  Final Clinical Impression(s) / ED Diagnoses Final diagnoses:  Sepsis, due to unspecified organism, unspecified whether acute organ dysfunction present [facility])  Community acquired pneumonia of right lower lobe of lung  AKI (acute kidney injury) ([place])  Hypertension, unspecified type  Hyperglycemia    Rx / DC Orders ED Discharge Orders    None       Franchot Heidelberg, PA-C 05/10/19 1655    Dorie Rank, MD 05/12/19 1249

## 2019-05-10 NOTE — Progress Notes (Signed)
HR remaining over 120, SBP 180s-210s, and DBP 110s sustained.  Added IV metroprolol and IV fluids.  Will continue to monitor.  Tylenol already given for fever.

## 2019-05-10 NOTE — Progress Notes (Signed)
   05/10/19 1959  Assess: MEWS Score  Temp 99.1 F (37.3 C)  BP (!) 203/118  Pulse Rate (!) 127  Resp (!) 24  Level of Consciousness Alert  SpO2 97 %  O2 Device Room Air  Assess: MEWS Score  MEWS Temp 0  MEWS Systolic 2  MEWS Pulse 2  MEWS RR 1  MEWS LOC 0  MEWS Score 5  MEWS Score Color Red  Assess: if the MEWS score is Yellow or Red  Were vital signs taken at a resting state? Yes  Focused Assessment Documented focused assessment  Early Detection of Sepsis Score *See Row Information* High  MEWS guidelines implemented *See Row Information* No, vital signs rechecked  Treat  MEWS Interventions Escalated (See documentation below);Administered prn meds/treatments  Take Vital Signs  Increase Vital Sign Frequency  Red: Q 1hr X 4 then Q 4hr X 4, if remains red, continue Q 4hrs  Escalate  MEWS: Escalate Red: discuss with charge nurse/RN and provider, consider discussing with RRT  Notify: Charge Nurse/RN  Name of Charge Nurse/RN Notified Debbie  Date Charge Nurse/RN Notified 05/10/19  Time Charge Nurse/RN Notified 1930  Notify: Provider  Provider Name/Title Katherina Right NP  Date Provider Notified 05/10/19  Time Provider Notified 2010  Notification Type Page  Notification Reason Change in status  Response See new orders  Date of Provider Response 05/10/19  Time of Provider Response 2010  Notify: Rapid Response  Name of Rapid Response RN Notified Misha [facility]  Date Rapid Response Notified 05/10/19  Time Rapid Response Notified 1945  contacted Katherina Right NP for TRH, gave patient 500 ml bolus NS and metoprolol 5 mg IV, patient responded well with the intervention. [place] protocol followed thru with steady improvement of  vital signs, patient is alert and asymptomatic thru the whole event. Patient is now MEWS 1, Will continue to monitor

## 2019-05-10 NOTE — H&P (Signed)
History and Physical    Emin Foree YYT:[id number] DOB: [birth year] DOA: 05/10/2019  PCP: Patient, No Pcp Per   Patient coming from: Home   Chief Complaint: Not feeling well and abdominal pain.   HPI: Brent Thompson is a 59 y.o. male with medical history significant of hypertension and type 2 diabetes mellitus.  He was transferred from [facility] with a working diagnosis of right lower lobe pneumonia. Patient presented to the ED with chief complaint of abdominal pain, constipation and not feeling well.  Apparently he had abdominal pain for about 7 days, he was seen in the emergency department on 05/06/19, with chief complaint of fever, myalgia, cough and diarrhea.  He was febrile and hypertensive, his COVID-19 was negative.  His chest x-ray was negative for infiltrates.  Patient was discharged home with follow-up with primary care.  At home patient continued to have abdominal pain, right upper quadrant, dull in nature, worse with eating, no improving factors, associated with persistent cough but no dyspnea, positive fevers and chills.  Decreased p.o. intake.  Due to persistent symptoms he presented to the hospital for further evaluation.  Has not had Covid vaccine yet, no sick contacts he is aware of.  He lives with his mother who has not been vaccinated for COVID-19   Review of Systems:  1. General: positive fevers and chills, no weight gain or weight loss 2. ENT: No runny nose or sore throat, no hearing disturbances 3. Pulmonary: No dyspnea, but positive dry cough with, wheezing, or hemoptysis 4. Cardiovascular: No angina, claudication, lower extremity edema, pnd or orthopnea 5. Gastrointestinal: No nausea or vomiting, no diarrhea, positive constipation 6. Hematology: No easy bruisability or frequent infections 7. Urology: No dysuria, hematuria or increased urinary frequency 8. Dermatology: No rashes. 9. Neurology: No seizures or paresthesias 10. Musculoskeletal: No joint pain  or deformities  Past Medical History:  Diagnosis Date  . Diabetes mellitus without complication (HCC)   . Hypertension     History reviewed. No pertinent surgical history.   reports that he has been smoking cigarettes. He has been smoking about 1.00 pack per day. He does not have any smokeless tobacco history on file. He reports current alcohol use. He reports that he does not use drugs.  No Known Allergies  No family history on file.   Prior to Admission medications   Not on File    Physical Exam: Vitals:   05/10/19 1530 05/10/19 1625 05/10/19 1630 05/10/19 1700  BP: 120/80 137/84 (!) 164/109   Pulse: 84 93 94   Resp: (!) 25 (!) 22 (!) 29   Temp:  99.8 F (37.7 C)    TempSrc:  Oral    SpO2: 96% 97% 99%   Weight:    66 kg  Height:    5\' 8"  (1.727 m)    Vitals:   05/10/19 1530 05/10/19 1625 05/10/19 1630 05/10/19 1700  BP: 120/80 137/84 (!) 164/109   Pulse: 84 93 94   Resp: (!) 25 (!) 22 (!) 29   Temp:  99.8 F (37.7 C)    TempSrc:  Oral    SpO2: 96% 97% 99%   Weight:    66 kg  Height:    5\' 8"  (1.727 m)   General:deconditioned and ill looking appearing Neurology: Awake and alert, non focal Head and Neck. Head normocephalic. Neck supple with no adenopathy or thyromegaly.   E ENT: mild pallor, no icterus, oral mucosa moist Cardiovascular: No JVD. S1-S2 present, rhythmic, no gallops,  rubs, or murmurs. No lower extremity edema. Pulmonary: positive breath sounds bilaterally,, no wheezing, or rhonchi positive rales at the right base. Gastrointestinal. Abdomen with no organomegaly, non tender, no rebound or guarding Skin. No rashes Musculoskeletal: no joint deformities    Labs on Admission: I have personally reviewed following labs and imaging studies  CBC: Recent Labs  Lab 05/06/19 0814 05/10/19 1049  WBC 9.7 9.4  NEUTROABS 7.8* 8.1*  HGB 11.8* 11.8*  HCT 35.3* 34.8*  MCV 96.7 94.6  PLT 185 197   Basic Metabolic Panel: Recent Labs  Lab  05/06/19 0814 05/10/19 1049  NA 134* 129*  K 3.6 3.2*  CL 96* 90*  CO2 22 22  GLUCOSE 195* 337*  BUN 17 28*  CREATININE 2.15* 2.62*  CALCIUM 9.2 9.3   GFR: Estimated Creatinine Clearance: 27.6 mL/min (A) (by C-G formula based on SCr of 2.62 mg/dL (H)). Liver Function Tests: Recent Labs  Lab 05/06/19 0814 05/10/19 1049  AST 75* 170*  ALT 39 85*  ALKPHOS 48 68  BILITOT 0.8 0.9  PROT 8.4* 8.5*  ALBUMIN 3.7 3.3*   No results for input(s): LIPASE, AMYLASE in the last 168 hours. No results for input(s): AMMONIA in the last 168 hours. Coagulation Profile: Recent Labs  Lab 05/10/19 1049  INR 1.0   Cardiac Enzymes: No results for input(s): CKTOTAL, CKMB, CKMBINDEX, TROPONINI in the last 168 hours. BNP (last 3 results) No results for input(s): PROBNP in the last 8760 hours. HbA1C: No results for input(s): HGBA1C in the last 72 hours. CBG: No results for input(s): GLUCAP in the last 168 hours. Lipid Profile: No results for input(s): CHOL, HDL, LDLCALC, TRIG, CHOLHDL, LDLDIRECT in the last 72 hours. Thyroid Function Tests: No results for input(s): TSH, T4TOTAL, FREET4, T3FREE, THYROIDAB in the last 72 hours. Anemia Panel: No results for input(s): VITAMINB12, FOLATE, FERRITIN, TIBC, IRON, RETICCTPCT in the last 72 hours. Urine analysis:    Component Value Date/Time   COLORURINE YELLOW 05/10/2019 1228   APPEARANCEUR CLOUDY (A) 05/10/2019 1228   LABSPEC >1.030 (H) 05/10/2019 1228   PHURINE 6.0 05/10/2019 1228   GLUCOSEU >=500 (A) 05/10/2019 1228   HGBUR LARGE (A) 05/10/2019 1228   BILIRUBINUR NEGATIVE 05/10/2019 1228   KETONESUR 15 (A) 05/10/2019 1228   PROTEINUR >300 (A) 05/10/2019 1228   NITRITE NEGATIVE 05/10/2019 1228   LEUKOCYTESUR NEGATIVE 05/10/2019 1228    Radiological Exams on Admission: CT ABDOMEN PELVIS WO CONTRAST  Result Date: 05/10/2019 CLINICAL DATA:  Abdominal pain, constipation, nausea and fevers. EXAM: CT ABDOMEN AND PELVIS WITHOUT CONTRAST  TECHNIQUE: Multidetector CT imaging of the abdomen and pelvis was performed following the standard protocol without IV contrast. COMPARISON:  03/07/2017 FINDINGS: Lower chest: Ground-glass attenuation and peribronchovascular consolidation noted involving much of the visualized portions of the right lower lobe concerning for pneumonia. Hepatobiliary: No focal liver abnormality is seen. No gallstones, gallbladder wall thickening, or biliary dilatation. Pancreas: Unremarkable. No pancreatic ductal dilatation or surrounding inflammatory changes. Spleen: Normal in size without focal abnormality. Adrenals/Urinary Tract: Normal appearance of the adrenal glands. Tiny, punctate stones identified in both kidneys, nonspecific. There is bilateral perinephric soft tissue stranding, new from previous exam. No hydronephrosis. Left kidney cyst measures 1.7 cm and is too small to characterize, image 23/2. The urinary bladder appears unremarkable. Stomach/Bowel: Stomach is within normal limits. The appendix is not confidently identified separate from the right lower quadrant bowel loops. No evidence of bowel wall thickening, distention, or inflammatory changes. Vascular/Lymphatic: Mild aortic atherosclerosis. There is  no aneurysm. No abdominopelvic adenopathy identified. No pelvic or inguinal adenopathy. Reproductive: Prostate is unremarkable. Other: No free fluid or fluid collections. Small fat containing umbilical hernia. Musculoskeletal: Scoliosis and degenerative disc disease. IMPRESSION: 1. Ground-glass attenuation and peribronchovascular densities noted involving much of the visualized portions of the right lower lobe concerning for pneumonia. 2. Bilateral perinephric soft tissue stranding is new from previous exam nonspecific. Correlate for any clinical signs or symptoms of pyelonephritis. 3. Tiny, punctate stones are identified in both kidneys no hydronephrosis, hydroureter or ureteral calculi. 4. Aortic atherosclerosis.  Aortic Atherosclerosis (ICD10-I70.0). Electronically Signed   By: Signa Kell M.D.   On: 05/10/2019 12:04   DG Chest Port 1 View  Result Date: 05/10/2019 CLINICAL DATA:  Fever. EXAM: PORTABLE CHEST 1 VIEW COMPARISON:  May 06, 2019. FINDINGS: The heart size and mediastinal contours are within normal limits. No pneumothorax or pleural effusion is noted. Left lung is clear. Mild right basilar opacity is noted concerning for pneumonia. The visualized skeletal structures are unremarkable. IMPRESSION: Mild right basilar opacity is noted concerning for pneumonia. Followup radiographs are recommended until resolution. Electronically Signed   By: Lupita Raider M.D.   On: 05/10/2019 11:56    EKG: Independently reviewed.  EKG 120 bpm, normal axis, normal intervals, sinus rhythm, precordial J-point elevation, no T wave changes, positive LVH.  Assessment/Plan Principal Problem:   Pneumonia Active Problems:   Sepsis (HCC)   Essential hypertension   Type 2 diabetes mellitus without complication (HCC)   AKI (acute kidney injury) (HCC)   Hypokalemia   59 year old male with hypertension and diabetes who presents with 2 weeks of not feeling well, right upper quadrant abdominal pain, fevers, chills and cough.  On his physical examination his ill looking appearing, temperature 99.9, blood pressure 188/177, respiratory rate 18, oxygen saturation 98% on room air, HR 99, his lungs have rales at the right base, heart S1-S2 present, rhythmic, soft abdomen no lower extremity edema.  Sodium 129, potassium 3.2, chloride 90, bicarb 22, glucose 337, BUN 28, creatinine 2.62, anion gap 17, lactic acid 2.5, white count 9.4, hemoglobin 9.8, hematocrit 34.8, platelets 197.  Urinalysis specific gravity >1.030, >300 protein, > 500 glucose, 0-5 white cells, 0-5 red cells.  CT of the abdomen with no acute abdominal changes, noted right lower lobe infiltrate, groundglass.  Chest radiograph with right lower lobe interstitial  infiltrate.    Patient will be admitted to the hospital with a working diagnosis of right lower lobe pneumonia complicated by sepsis, endorgan damage lactic acidosis and acute kidney injury.   1.  Right lower lobe community-acquired pneumonia, present on admission, complicated by sepsis, end-organ failure lactic acidosis and acute kidney injury. Patient admitted to the telemetry ward, continue antibiotic therapy with ceftriaxone and azithromycin, intravenous fluids with isotonic saline at 100 mL/h.  Follow-up on cultures, temperature curve and cell count.  Repeat SARS COVID-19 still pending, but 4 days ago was negative.  At bronchodilator and as needed antitussive agents  Patient with high risk of worsening sepsis.  2.  Acute kidney injury, hyponatremia, hypokalemia, hypochloremia, anion gap metabolic acidosis.  Urine with significant concentration, specific gravity more than 1.030.  Will continue hydration with isotonic saline at 100 mL/h, potassium correction of potassium chloride, 80 mEq in 2 divided doses.  Follow-up kidney function in the morning + Mg, avoid hypotension nephrotoxic agents.  3.  Uncontrolled type 2 diabetes mellitus.  Will add insulin sliding scale for glucose coverage and monitoring.  Diabetic diet.  4.  Hypertension.  Will hold oral antihypertensive agents for now, as needed hydralazine in case of uncontrolled hypertension through the night.   DVT prophylaxis:  Enoxaparin  Code Status:  full  Family Communication: no family at the bedside   Disposition Plan: telemetry  Consults called: none   Admission status: inpatient     Brienna Bass Gerome Apley MD [facility]   05/10/2019, 5:33 PM

## 2019-05-10 NOTE — Progress Notes (Signed)
Notified bedside nurse of need to administer antibiotics.  

## 2019-05-10 NOTE — ED Notes (Signed)
Date and time results received: 05/10/19 1128  Test: Lactic Acid  Critical Value: 2.5  Name of Provider Notified: Sophia Caccavale and Excell Seltzer

## 2019-05-10 NOTE — ED Notes (Signed)
Mother Myriam Jacobson made aware of room assignment.

## 2019-05-11 ENCOUNTER — Encounter (HOSPITAL_COMMUNITY): Payer: Self-pay | Admitting: Internal Medicine

## 2019-05-11 LAB — BASIC METABOLIC PANEL
Anion gap: 12 (ref 5–15)
BUN: 29 mg/dL — ABNORMAL HIGH (ref 8–23)
CO2: 21 mmol/L — ABNORMAL LOW (ref 22–32)
Calcium: 8.6 mg/dL — ABNORMAL LOW (ref 8.9–10.3)
Chloride: 100 mmol/L (ref 98–111)
Creatinine, Ser: 2.43 mg/dL — ABNORMAL HIGH (ref 0.61–1.24)
GFR calc Af Amer: 32 mL/min — ABNORMAL LOW (ref >60)
GFR calc non Af Amer: 28 mL/min — ABNORMAL LOW (ref >60)
Glucose, Bld: 284 mg/dL — ABNORMAL HIGH (ref 70–99)
Potassium: 3.1 mmol/L — ABNORMAL LOW (ref 3.5–5.1)
Sodium: 133 mmol/L — ABNORMAL LOW (ref 135–145)

## 2019-05-11 LAB — CBC
HCT: 29.4 % — ABNORMAL LOW (ref 39.0–52.0)
Hemoglobin: 9.7 g/dL — ABNORMAL LOW (ref 13.0–17.0)
MCH: 31.9 pg (ref 26.0–34.0)
MCHC: 33 g/dL (ref 30.0–36.0)
MCV: 96.7 fL (ref 80.0–100.0)
Platelets: 185 10*3/uL (ref 150–400)
RBC: 3.04 MIL/uL — ABNORMAL LOW (ref 4.22–5.81)
RDW: 14.4 % (ref 11.5–15.5)
WBC: 8 10*3/uL (ref 4.0–10.5)
nRBC: 0 % (ref 0.0–0.2)

## 2019-05-11 LAB — MAGNESIUM: Magnesium: 1.9 mg/dL (ref 1.7–2.4)

## 2019-05-11 LAB — GLUCOSE, CAPILLARY
Glucose-Capillary: 302 mg/dL — ABNORMAL HIGH (ref 70–99)
Glucose-Capillary: 282 mg/dL — ABNORMAL HIGH (ref 70–99)
Glucose-Capillary: 278 mg/dL — ABNORMAL HIGH (ref 70–99)
Glucose-Capillary: 120 mg/dL — ABNORMAL HIGH (ref 70–99)
Glucose-Capillary: 118 mg/dL — ABNORMAL HIGH (ref 70–99)
Glucose-Capillary: 102 mg/dL — ABNORMAL HIGH (ref 70–99)

## 2019-05-11 LAB — URINE CULTURE: Culture: NO GROWTH

## 2019-05-11 MED ORDER — INSULIN DETEMIR 100 UNIT/ML ~~LOC~~ SOLN
10.0000 [IU] | Freq: Every day | SUBCUTANEOUS | Status: DC
  Administered 2019-05-11 – 2019-05-13 (×3): 10 [IU] via SUBCUTANEOUS
  Filled 2019-05-11 (×3): qty 0.1

## 2019-05-11 MED ORDER — SODIUM CHLORIDE 0.9 % IV SOLN: INTRAVENOUS | Status: DC | PRN

## 2019-05-11 MED ORDER — POTASSIUM CHLORIDE CRYS ER 20 MEQ PO TBCR
40.0000 meq | EXTENDED_RELEASE_TABLET | Freq: Once | ORAL | Status: AC
  Administered 2019-05-11: 40 meq via ORAL
  Filled 2019-05-11: qty 2

## 2019-05-11 MED ORDER — AMLODIPINE BESYLATE 5 MG PO TABS
5.0000 mg | ORAL_TABLET | Freq: Every day | ORAL | Status: DC
  Administered 2019-05-11: 5 mg via ORAL
  Filled 2019-05-11: qty 1

## 2019-05-11 NOTE — Plan of Care (Signed)
  Problem: Clinical Measurements: Goal: Ability to maintain clinical measurements within normal limits will improve Description: See MEWs note Outcome: Progressing

## 2019-05-11 NOTE — Progress Notes (Signed)
PROGRESS NOTE    Brent Thompson  PJK:[id number] DOB: 02-28-1960 DOA: 05/10/2019 PCP: Patient, No Pcp Per    Brief Narrative:  Patient will be admitted to the hospital with a working diagnosis of right lower lobe pneumonia complicated by sepsis, endorgan damage lactic acidosis and acute kidney injury.   59 year old male with hypertension and diabetes who presents with 2 weeks of not feeling well, right upper quadrant abdominal pain, fevers, chills and cough.  On his physical examination his ill looking appearing, temperature 99.9, blood pressure 188/177, respiratory rate 18, oxygen saturation 98% on room air, HR 99, his lungs have rales at the right base, heart S1-S2 present, rhythmic, soft abdomen no lower extremity edema.  Sodium 129, potassium 3.2, chloride 90, bicarb 22, glucose 337, BUN 28, creatinine 2.62, anion gap 17, lactic acid 2.5, white count 9.4, hemoglobin 9.8, hematocrit 34.8, platelets 197.  Urinalysis specific gravity >1.030, >300 protein, > 500 glucose, 0-5 white cells, 0-5 red cells.  CT of the abdomen with no acute abdominal changes, noted right lower lobe infiltrate, groundglass.  Chest radiograph with right lower lobe interstitial infiltrate.      Assessment & Plan:   Principal Problem:   Pneumonia Active Problems:   Sepsis (HCC)   Essential hypertension   Type 2 diabetes mellitus without complication (HCC)   AKI (acute kidney injury) (HCC)   Hypokalemia    1.  Right lower lobe community-acquired pneumonia, present on admission, complicated by sepsis, end-organ failure lactic acidosis and acute kidney injury. T max this am was 100.8 F, wbc 8,0. Cultures with no growth.   Continue with antibiotic therapy with ceftriaxone and azithromycin, dc IV fluids, follow a restrictive IV fluids strategy. Continue with bronchodilator as needed antitussive agents, and will add incentive spirometer.   Patient continue with high risk of worsening sepsis.  2.  Acute kidney  injury, hyponatremia, hypokalemia, hypochloremia, anion gap metabolic acidosis. Renal function with serum cr at 2,43 from 2,62, K down to 3,1 and serum bicarbonate at 21 with Cl at 100.   Patient is tolerating po well, will hold on IV fluids for now and will continue to follow up renal function in am, continue k correction with kcl 40 meq. I suspect patient has CKD, not know baseline at this point.  PT/OT and out of bed to chair tid with meals.   3.  Uncontrolled type 2 diabetes mellitus. Fasting glucose this am 284. Patient is tolerating po well, will add basal insulin 10 units and weill continue with  insulin sliding scale for glucose coverage and monitoring.   4.  Hypertension/ uncontrolled.  Blood pressure this am 148.96 mmHG. Continue with amlodipine 5 mg daily for now and as needed hydralazine. Patient stop taking blood pressure medications many years ago, he was not able to afford medications.   DVT prophylaxis: enoxaparin   Code Status:  full Family Communication: no family at the bedside.  Disposition Plan/ discharge barriers: patient form home barrier for dc pneumonia requiring iV antibiotic therapy.     Antimicrobials:   Ceftriaxone and azithromycin     Subjective: Patient is feeling better, dyspnea has improved but not yet back to baselie, no nausea or vomiting. Improved abdominal pain but not resolved.   Objective: Vitals:   05/10/19 2239 05/11/19 0308 05/11/19 0649 05/11/19 1049  BP: 120/74 (!) 137/93 (!) 146/93 (!) 148/96  Pulse: 84 (!) 101 91 89  Resp: (!) 24 17 18  (!) 25  Temp: 99.1 F (37.3 C) 100 F (37.8  C) (!) 100.8 F (38.2 C) 100.1 F (37.8 C)  TempSrc: Oral   Oral  SpO2: 94% 96% 93% 98%  Weight:      Height:        Intake/Output Summary (Last 24 hours) at 05/11/2019 1138 Last data filed at 05/11/2019 0930 Gross per 24 hour  Intake 2591.06 ml  Output 250 ml  Net 2341.06 ml   Filed Weights   05/10/19 1002 05/10/19 1700  Weight: 72.6 kg 66 kg      Examination:   General: Not in pain or dyspnea, deconditioned  Neurology: Awake and alert, non focal  E ENT: mild pallor, no icterus, oral mucosa moist Cardiovascular: No JVD. S1-S2 present, rhythmic, no gallops, rubs, or murmurs. No lower extremity edema. Pulmonary: positive breath sounds bilaterally, with no wheezing, or rhonchi positive rales at the right base Gastrointestinal. Abdomen with no organomegaly, non tender, no rebound or guarding Skin. No rashes Musculoskeletal: no joint deformities     Data Reviewed: I have personally reviewed following labs and imaging studies  CBC: Recent Labs  Lab 05/06/19 0814 05/10/19 1049 05/11/19 0608  WBC 9.7 9.4 8.0  NEUTROABS 7.8* 8.1*  --   HGB 11.8* 11.8* 9.7*  HCT 35.3* 34.8* 29.4*  MCV 96.7 94.6 96.7  PLT 185 197 741   Basic Metabolic Panel: Recent Labs  Lab 05/06/19 0814 05/10/19 1049 05/11/19 0608  NA 134* 129* 133*  K 3.6 3.2* 3.1*  CL 96* 90* 100  CO2 22 22 21*  GLUCOSE 195* 337* 284*  BUN 17 28* 29*  CREATININE 2.15* 2.62* 2.43*  CALCIUM 9.2 9.3 8.6*  MG  --   --  1.9   GFR: Estimated Creatinine Clearance: 29.8 mL/min (A) (by C-G formula based on SCr of 2.43 mg/dL (H)). Liver Function Tests: Recent Labs  Lab 05/06/19 0814 05/10/19 1049  AST 75* 170*  ALT 39 85*  ALKPHOS 48 68  BILITOT 0.8 0.9  PROT 8.4* 8.5*  ALBUMIN 3.7 3.3*   No results for input(s): LIPASE, AMYLASE in the last 168 hours. No results for input(s): AMMONIA in the last 168 hours. Coagulation Profile: Recent Labs  Lab 05/10/19 1049  INR 1.0   Cardiac Enzymes: No results for input(s): CKTOTAL, CKMB, CKMBINDEX, TROPONINI in the last 168 hours. BNP (last 3 results) No results for input(s): PROBNP in the last 8760 hours. HbA1C: No results for input(s): HGBA1C in the last 72 hours. CBG: Recent Labs  Lab 05/11/19 0430 05/11/19 0733  GLUCAP 282* 278*   Lipid Profile: No results for input(s): CHOL, HDL, LDLCALC, TRIG,  CHOLHDL, LDLDIRECT in the last 72 hours. Thyroid Function Tests: No results for input(s): TSH, T4TOTAL, FREET4, T3FREE, THYROIDAB in the last 72 hours. Anemia Panel: No results for input(s): VITAMINB12, FOLATE, FERRITIN, TIBC, IRON, RETICCTPCT in the last 72 hours.    Radiology Studies: I have reviewed all of the imaging during this hospital visit personally     Scheduled Meds: . azithromycin  500 mg Oral Daily  . insulin aspart  0-15 Units Subcutaneous TID WC  . pantoprazole  40 mg Oral Daily  . polyethylene glycol  17 g Oral BID   Continuous Infusions: . sodium chloride Stopped (05/11/19 0612)  . cefTRIAXone (ROCEPHIN)  IV 1 g (05/11/19 2878)     LOS: 1 day        Miriam Liles Gerome Apley, MD

## 2019-05-12 LAB — CBC WITH DIFFERENTIAL/PLATELET
Abs Immature Granulocytes: 0.07 10*3/uL (ref 0.00–0.07)
Basophils Absolute: 0 10*3/uL (ref 0.0–0.1)
Basophils Relative: 0 %
Eosinophils Absolute: 0 10*3/uL (ref 0.0–0.5)
Eosinophils Relative: 0 %
HCT: 27.1 % — ABNORMAL LOW (ref 39.0–52.0)
Hemoglobin: 9.2 g/dL — ABNORMAL LOW (ref 13.0–17.0)
Immature Granulocytes: 1 %
Lymphocytes Relative: 14 %
Lymphs Abs: 0.8 10*3/uL (ref 0.7–4.0)
MCH: 32.6 pg (ref 26.0–34.0)
MCHC: 33.9 g/dL (ref 30.0–36.0)
MCV: 96.1 fL (ref 80.0–100.0)
Monocytes Absolute: 0.4 10*3/uL (ref 0.1–1.0)
Monocytes Relative: 7 %
Neutro Abs: 4.7 10*3/uL (ref 1.7–7.7)
Neutrophils Relative %: 78 %
Platelets: 189 10*3/uL (ref 150–400)
RBC: 2.82 MIL/uL — ABNORMAL LOW (ref 4.22–5.81)
RDW: 14.6 % (ref 11.5–15.5)
WBC: 6.1 10*3/uL (ref 4.0–10.5)
nRBC: 0 % (ref 0.0–0.2)

## 2019-05-12 LAB — BASIC METABOLIC PANEL
Anion gap: 9 (ref 5–15)
BUN: 23 mg/dL (ref 8–23)
CO2: 21 mmol/L — ABNORMAL LOW (ref 22–32)
Calcium: 8.6 mg/dL — ABNORMAL LOW (ref 8.9–10.3)
Chloride: 101 mmol/L (ref 98–111)
Creatinine, Ser: 2.2 mg/dL — ABNORMAL HIGH (ref 0.61–1.24)
GFR calc Af Amer: 36 mL/min — ABNORMAL LOW (ref >60)
GFR calc non Af Amer: 31 mL/min — ABNORMAL LOW (ref >60)
Glucose, Bld: 105 mg/dL — ABNORMAL HIGH (ref 70–99)
Potassium: 3 mmol/L — ABNORMAL LOW (ref 3.5–5.1)
Sodium: 131 mmol/L — ABNORMAL LOW (ref 135–145)

## 2019-05-12 LAB — HEMOGLOBIN A1C
Hgb A1c MFr Bld: 8.1 % — ABNORMAL HIGH (ref 4.8–5.6)
Mean Plasma Glucose: 186 mg/dL

## 2019-05-12 LAB — GLUCOSE, CAPILLARY
Glucose-Capillary: 94 mg/dL (ref 70–99)
Glucose-Capillary: 158 mg/dL — ABNORMAL HIGH (ref 70–99)
Glucose-Capillary: 111 mg/dL — ABNORMAL HIGH (ref 70–99)
Glucose-Capillary: 150 mg/dL — ABNORMAL HIGH (ref 70–99)

## 2019-05-12 MED ORDER — AMLODIPINE BESYLATE 10 MG PO TABS
10.0000 mg | ORAL_TABLET | Freq: Every day | ORAL | Status: DC
  Administered 2019-05-12 – 2019-05-13 (×2): 10 mg via ORAL
  Filled 2019-05-12 (×2): qty 1

## 2019-05-12 MED ORDER — ALBUTEROL SULFATE (2.5 MG/3ML) 0.083% IN NEBU: 2.5000 mg | INHALATION_SOLUTION | RESPIRATORY_TRACT | Status: DC | PRN

## 2019-05-12 MED ORDER — METFORMIN HCL 500 MG PO TABS
500.0000 mg | ORAL_TABLET | Freq: Every day | ORAL | Status: DC
  Administered 2019-05-13: 500 mg via ORAL
  Filled 2019-05-12 (×2): qty 1

## 2019-05-12 MED ORDER — IPRATROPIUM-ALBUTEROL 0.5-2.5 (3) MG/3ML IN SOLN
3.0000 mL | Freq: Two times a day (BID) | RESPIRATORY_TRACT | Status: DC
  Administered 2019-05-12 – 2019-05-13 (×2): 3 mL via RESPIRATORY_TRACT
  Filled 2019-05-12 (×2): qty 3

## 2019-05-12 MED ORDER — IPRATROPIUM-ALBUTEROL 0.5-2.5 (3) MG/3ML IN SOLN
3.0000 mL | Freq: Four times a day (QID) | RESPIRATORY_TRACT | Status: DC
  Administered 2019-05-12: 3 mL via RESPIRATORY_TRACT
  Filled 2019-05-12: qty 3

## 2019-05-12 MED ORDER — CALCIUM CARBONATE ANTACID 500 MG PO CHEW
1.0000 | CHEWABLE_TABLET | Freq: Every day | ORAL | Status: DC | PRN
  Administered 2019-05-12: 200 mg via ORAL
  Filled 2019-05-12: qty 1

## 2019-05-12 MED ORDER — POTASSIUM CHLORIDE CRYS ER 20 MEQ PO TBCR
40.0000 meq | EXTENDED_RELEASE_TABLET | ORAL | Status: AC
  Administered 2019-05-12 (×2): 40 meq via ORAL
  Filled 2019-05-12 (×2): qty 2

## 2019-05-12 NOTE — Evaluation (Signed)
Occupational Therapy Evaluation Patient Details Name: Brent Thompson MRN: [id number] DOB: 1960-02-06 Today's Date: 05/12/2019    History of Present Illness 59 year old male with hypertension and diabetes and admitted to the hospital with a working diagnosis of right lower lobe pneumonia complicated by sepsis, endorgan damage lactic acidosis and acute kidney injury.   Clinical Impression   Pt admitted with the above diagnoses and presents with below problem list. Pt will benefit from continued acute OT to address the below listed deficits and maximize independence with basic ADLs prior to d/c home. PTA pt was independent with ADLs, drives, mows the lawn. Pt presents with decreased activity tolerance and some decreased balance during dynamic ADL tasks OOB. Pt on RA throughout session. Of note pt's O2 dropped to 88 while sitting EOB after ambulating in the room. O2 sat quickly recovered to 92 with cues for basic breathing exercise. HR in the low 100s throughout session.      Follow Up Recommendations  No OT follow up    Equipment Recommendations  None recommended by OT    Recommendations for Other Services       Precautions / Restrictions Precautions Precautions: None Restrictions Weight Bearing Restrictions: No      Mobility Bed Mobility Overal bed mobility: Needs Assistance Bed Mobility: Sit to Supine       Sit to supine: Modified independent (Device/Increase time)      Transfers Overall transfer level: Needs assistance Equipment used: None Transfers: Sit to/from Stand Sit to Stand: Min guard         General transfer comment: min guard for safety. to/from couch and EOB    Balance Overall balance assessment: Mild deficits observed, not formally tested                                         ADL either performed or assessed with clinical judgement   ADL Overall ADL's : Needs assistance/impaired Eating/Feeding: Set up;Sitting   Grooming:  Supervision/safety;Standing   Upper Body Bathing: Set up;Sitting   Lower Body Bathing: Min guard;Sit to/from stand   Upper Body Dressing : Set up;Sitting   Lower Body Dressing: Min guard;Sit to/from stand   Toilet Transfer: Min guard;Ambulation   Toileting- Clothing Manipulation and Hygiene: Min guard;Sit to/from stand   Tub/ Shower Transfer: Min guard;Tub transfer   Functional mobility during ADLs: Min guard General ADL Comments: Pt completed in room functional mobility incorporating turns and obstacles. Discussed building activity tolerance and basic energy conservation education.      Vision Baseline Vision/History: Wears glasses Wears Glasses: At all times       Perception     Praxis      Pertinent Vitals/Pain Pain Assessment: No/denies pain     Hand Dominance     Extremity/Trunk Assessment Upper Extremity Assessment Upper Extremity Assessment: Overall WFL for tasks assessed   Lower Extremity Assessment Lower Extremity Assessment: Overall WFL for tasks assessed       Communication Communication Communication: No difficulties   Cognition Arousal/Alertness: Awake/alert Behavior During Therapy: WFL for tasks assessed/performed Overall Cognitive Status: Within Functional Limits for tasks assessed                                 General Comments: some slow processing noted. appropriate, aware of situation; RN reports mild confusion this morning   General  Comments       Exercises     Shoulder Instructions      Home Living Family/patient expects to be discharged to:: Private residence Living Arrangements: Other (Comment)("mother")   Type of Home: House Home Access: Stairs to enter [employer] of Steps: 2   Home Layout: Two level Alternate Level Stairs-Number of Steps: split level   Bathroom Shower/Tub: Other (comment)(sponge bath)   Bathroom Toilet: Standard     Home Equipment: Walker - 2 wheels          Prior  Functioning/Environment Level of Independence: Independent                 OT Problem List: Decreased activity tolerance;Decreased strength;Impaired balance (sitting and/or standing);Decreased knowledge of use of DME or AE;Decreased knowledge of precautions;Cardiopulmonary status limiting activity      OT Treatment/Interventions: Self-care/ADL training;Therapeutic exercise;Energy conservation;DME and/or AE instruction;Therapeutic activities;Patient/family education;Balance training    OT Goals(Current goals can be found in the care plan section) Acute Rehab OT Goals Patient Stated Goal: spoke about wanting to quit smoking. feel better. OT Goal Formulation: With patient Time For Goal Achievement: 05/26/19 Potential to Achieve Goals: Good ADL Goals Pt Will Perform Grooming: with modified independence;standing Pt Will Perform Lower Body Bathing: with modified independence;sit to/from stand Pt Will Perform Lower Body Dressing: with modified independence;sit to/from stand Pt Will Transfer to Toilet: with modified independence;ambulating Pt Will Perform Toileting - Clothing Manipulation and hygiene: with modified independence;sit to/from stand  OT Frequency: Min 2X/week   Barriers to D/C:            Co-evaluation              AM-PAC OT "6 Clicks" Daily Activity     Outcome Measure Help from another person eating meals?: None Help from another person taking care of personal grooming?: None Help from another person toileting, which includes using toliet, bedpan, or urinal?: A Little Help from another person bathing (including washing, rinsing, drying)?: A Little Help from another person to put on and taking off regular upper body clothing?: None Help from another person to put on and taking off regular lower body clothing?: A Little 6 Click Score: 21   End of Session Nurse Communication: Other (comment)(cup of meds on table. Pt has not taken yet. )  Activity Tolerance:  Patient tolerated treatment well Patient left: in bed;with call bell/phone within reach;with bed alarm set  OT Visit Diagnosis: Unsteadiness on feet (R26.81);Pain;Muscle weakness (generalized) (M62.81)                Time: 0932-3557 OT Time Calculation (min): 18 min Charges:  OT General Charges $OT Visit: 1 Visit OT Evaluation $OT Eval Low Complexity: 1 Low  Raynald Kemp, OT Acute Rehabilitation Services Pager: [phone] Office: [phone]   Pilar Grammes 05/12/2019, 12:16 PM

## 2019-05-12 NOTE — Progress Notes (Signed)
PROGRESS NOTE    Caeleb Batalla  ZOX:[id number] DOB: [birth year] DOA: 05/10/2019 PCP: Patient, No Pcp Per    Brief Narrative:  Patient admitted to the hospital with a working diagnosis of right lower lobe pneumonia complicated by sepsis, endorgan failure lactic acidosis and acute kidney injury.   59 year old male with hypertension and diabetes who presents with 2 weeks of not feeling well, right upper quadrant abdominal pain, fevers, chills and cough. On his physical examination his ill looking appearing, temperature 99.9, blood pressure 188/177, respiratory rate 18, oxygen saturation 98% on room air, HR 99,his lungs have rales at the right base, heart S1-S2 present, rhythmic, soft abdomen no lower extremity edema. Sodium 129, potassium 3.2, chloride 90, bicarb 22, glucose 337, BUN 28, creatinine 2.62, anion gap 17, lactic acid 2.5, white count 9.4, hemoglobin 9.8, hematocrit 34.8, platelets 197.Urinalysis specific gravity >1.030,>300 protein,>500 glucose, 0-5 white cells, 0-5 red cells.CT of the abdomen with no acute abdominal changes, noted right lower lobe infiltrate,groundglass. Chest radiograph with right lower lobe interstitial infiltrate.   Patient placed on IV antibiotic therapy, supplemental oxygen, bronchodilators therapy and IV fluids with improvement in his symptoms.   Assessment & Plan:   Principal Problem:   Pneumonia Active Problems:   Sepsis (HCC)   Essential hypertension   Type 2 diabetes mellitus without complication (HCC)   AKI (acute kidney injury) (HCC)   Hypokalemia    1.Right lower lobe community-acquired pneumonia, present on admission, complicated by sepsis, end-organ failure lactic acidosis and acute kidney injury. T max this am was 100.4 F, wbc 9.2. Cultures continue with no growth.   Antibiotic therapy with ceftriaxone and azithromycin with good toleration. On bronchodilator scheduled and as needed, continue with antitussive agents, incentive  spirometer and will add flutter valve. Out of bed to chair tid with meals, ot and pt evaluation.   Possible dc home in am, if afebrile.   2.Acute kidney injury,hyponatremia, hypokalemia, hypochloremia,anion gap metabolic acidosis. I suspect patient has CKD, not able to stage yet. Renal function with serum cr at 2,20,  K down to 3,0 and serum bicarbonate at 21.   Will give 80 meq Kcl in 2 dived doses and will follow up renal panel in am, continue to hold on IV fluids for now.   3.Uncontrolled type 2 diabetes mellitus/ Hbg A1c 8,1.Fasting glucose this am 104. Continue with basal insulin 10 units and insulin sliding scale for glucose coverage and monitoring.  Will start patient on metformin, in the setting of possible discharge in am.   4.Hypertension/ uncontrolled.  Systolic blood pressure 137 to 146, diastolic 93 to 100, will increase amlodipine to 10  mg daily and as needed hydralazine.   Patient stop taking blood pressure medications many years ago, he was not able to afford medications.   5. Alcohol and tobacco abuse. Alcohol and smoking cessation counseling.   DVT prophylaxis: enoxaparin   Code Status:  full Family Communication: no family at the bedside.  Disposition Plan/ discharge barriers: patient form home barrier for dc pneumonia requiring iV antibiotic therapy.     Subjective: Patient is feeling better, but not yet back to baseline, no nausea or vomiting, improving cough and abdominal pain.   Objective: Vitals:   05/11/19 1613 05/11/19 2020 05/11/19 2246 05/12/19 0444  BP: (!) 135/94 (!) 172/103 (!) 155/104 (!) 142/92  Pulse: 79 92 95 92  Resp:  18  18  Temp:  98.8 F (37.1 C)  (!) 100.4 F (38 C)  TempSrc:  SpO2:  98%  97%  Weight:      Height:        Intake/Output Summary (Last 24 hours) at 05/12/2019 0921 Last data filed at 05/11/2019 1637 Gross per 24 hour  Intake 699.55 ml  Output -  Net 699.55 ml   Filed Weights   05/10/19 1002  05/10/19 1700  Weight: 72.6 kg 66 kg    Examination:   General: Not in pain or dyspnea, deconditioned  Neurology: Awake and alert, non focal  E ENT: mild pallor, no icterus, oral mucosa moist Cardiovascular: No JVD. S1-S2 present, rhythmic, no gallops, rubs, or murmurs. No lower extremity edema. Pulmonary: positive breath sounds bilaterally, adequate air movement, no wheezing or rhonchi, positive rales at the right base. Gastrointestinal. Abdomen with , no organomegaly, non tender, no rebound or guarding Skin. No rashes Musculoskeletal: no joint deformities     Data Reviewed: I have personally reviewed following labs and imaging studies  CBC: Recent Labs  Lab 05/06/19 0814 05/10/19 1049 05/11/19 0608 05/12/19 0558  WBC 9.7 9.4 8.0 6.1  NEUTROABS 7.8* 8.1*  --  4.7  HGB 11.8* 11.8* 9.7* 9.2*  HCT 35.3* 34.8* 29.4* 27.1*  MCV 96.7 94.6 96.7 96.1  PLT 185 197 185 301   Basic Metabolic Panel: Recent Labs  Lab 05/06/19 0814 05/10/19 1049 05/11/19 0608 05/12/19 0558  NA 134* 129* 133* 131*  K 3.6 3.2* 3.1* 3.0*  CL 96* 90* 100 101  CO2 22 22 21* 21*  GLUCOSE 195* 337* 284* 105*  BUN 17 28* 29* 23  CREATININE 2.15* 2.62* 2.43* 2.20*  CALCIUM 9.2 9.3 8.6* 8.6*  MG  --   --  1.9  --    GFR: Estimated Creatinine Clearance: 32.9 mL/min (A) (by C-G formula based on SCr of 2.2 mg/dL (H)). Liver Function Tests: Recent Labs  Lab 05/06/19 0814 05/10/19 1049  AST 75* 170*  ALT 39 85*  ALKPHOS 48 68  BILITOT 0.8 0.9  PROT 8.4* 8.5*  ALBUMIN 3.7 3.3*   No results for input(s): LIPASE, AMYLASE in the last 168 hours. No results for input(s): AMMONIA in the last 168 hours. Coagulation Profile: Recent Labs  Lab 05/10/19 1049  INR 1.0   Cardiac Enzymes: No results for input(s): CKTOTAL, CKMB, CKMBINDEX, TROPONINI in the last 168 hours. BNP (last 3 results) No results for input(s): PROBNP in the last 8760 hours. HbA1C: Recent Labs    05/10/19 1834  HGBA1C 8.1*    CBG: Recent Labs  Lab 05/11/19 0733 05/11/19 1140 05/11/19 1641 05/11/19 2018 05/12/19 0727  GLUCAP 278* 120* 118* 102* 94   Lipid Profile: No results for input(s): CHOL, HDL, LDLCALC, TRIG, CHOLHDL, LDLDIRECT in the last 72 hours. Thyroid Function Tests: No results for input(s): TSH, T4TOTAL, FREET4, T3FREE, THYROIDAB in the last 72 hours. Anemia Panel: No results for input(s): VITAMINB12, FOLATE, FERRITIN, TIBC, IRON, RETICCTPCT in the last 72 hours.    Radiology Studies: I have reviewed all of the imaging during this hospital visit personally     Scheduled Meds: . amLODipine  5 mg Oral Daily  . azithromycin  500 mg Oral Daily  . insulin aspart  0-15 Units Subcutaneous TID WC  . insulin detemir  10 Units Subcutaneous Daily  . pantoprazole  40 mg Oral Daily  . polyethylene glycol  17 g Oral BID   Continuous Infusions: . sodium chloride    . cefTRIAXone (ROCEPHIN)  IV Stopped (05/11/19 1637)     LOS: 2 days  Madysen Faircloth Gerome Apley, MD

## 2019-05-12 NOTE — Evaluation (Signed)
Physical Therapy Evaluation Patient Details Name: Brent Thompson MRN: [id number] DOB: [birth year] Today's Date: 05/12/2019   History of Present Illness  59 year old male with hypertension and diabetes and admitted to the hospital with a working diagnosis of right lower lobe pneumonia complicated by sepsis, endorgan damage lactic acidosis and acute kidney injury.  Clinical Impression  Pt admitted with above diagnosis.  Pt currently with functional limitations due to the deficits listed below (see PT Problem List). Pt will benefit from skilled PT to increase their independence and safety with mobility to allow discharge to the venue listed below.  Pt ambulated in hallway with RW.  Pt reports he does have a RW at home however will likely progress to no DME needs.       Follow Up Recommendations No PT follow up    Equipment Recommendations  None recommended by PT    Recommendations for Other Services       Precautions / Restrictions Precautions Precautions: None Restrictions Weight Bearing Restrictions: No      Mobility  Bed Mobility Overal bed mobility: Modified Independent Bed Mobility: Sit to Supine       Sit to supine: Modified independent (Device/Increase time)      Transfers Overall transfer level: Needs assistance Equipment used: None Transfers: Sit to/from Stand Sit to Stand: Min guard         General transfer comment: for safety  Ambulation/Gait Ambulation/Gait assistance: Min guard Gait Distance (Feet): 400 Feet Assistive device: Rolling walker (2 wheeled) Gait Pattern/deviations: Step-through pattern;Decreased stride length     General Gait Details: pt utilized RW as safety net (states he hasn't walked outside of room in days); pt occasionally lifting RW so likely not needed for constant support, able to ambulate without RW within room as well; Spo2 92% on room air upon returning from ambulating  Stairs            Wheelchair Mobility    Modified  Rankin (Stroke Patients Only)       Balance Overall balance assessment: Mild deficits observed, not formally tested                                           Pertinent Vitals/Pain Pain Assessment: No/denies pain    Home Living Family/patient expects to be discharged to:: Private residence Living Arrangements: Other (Comment)("mother")   Type of Home: House Home Access: Stairs to enter   Technical [employer]wer of Steps: 2 Home Layout: Two level Home Equipment: Environmental consultant - 2 wheels      Prior Function Level of Independence: Independent               Hand Dominance        Extremity/Trunk Assessment   Upper Extremity Assessment Upper Extremity Assessment: Overall WFL for tasks assessed    Lower Extremity Assessment Lower Extremity Assessment: Overall WFL for tasks assessed       Communication   Communication: No difficulties  Cognition Arousal/Alertness: Awake/alert Behavior During Therapy: WFL for tasks assessed/performed Overall Cognitive Status: Within Functional Limits for tasks assessed                                 General Comments: some slow processing noted. appropriate, aware of situation; RN reports mild confusion this morning      General Comments  Exercises     Assessment/Plan    PT Assessment Patient needs continued PT services  PT Problem List Decreased strength;Decreased mobility;Decreased activity tolerance;Decreased balance;Decreased knowledge of use of DME       PT Treatment Interventions DME instruction;Therapeutic activities;Gait training;Therapeutic exercise;Patient/family education;Functional mobility training;Stair training;Balance training    PT Goals (Current goals can be found in the Care Plan section)  Acute Rehab PT Goals Patient Stated Goal: spoke about wanting to quit smoking. feel better. PT Goal Formulation: With patient Time For Goal Achievement: 05/19/19 Potential to Achieve  Goals: Good    Frequency Min 3X/week   Barriers to discharge        Co-evaluation               AM-PAC PT "6 Clicks" Mobility  Outcome Measure Help needed turning from your back to your side while in a flat bed without using bedrails?: None Help needed moving from lying on your back to sitting on the side of a flat bed without using bedrails?: None Help needed moving to and from a bed to a chair (including a wheelchair)?: None Help needed standing up from a chair using your arms (e.g., wheelchair or bedside chair)?: None Help needed to walk in hospital room?: A Little Help needed climbing 3-5 steps with a railing? : A Little 6 Click Score: 22    End of Session Equipment Utilized During Treatment: Gait belt Activity Tolerance: Patient tolerated treatment well Patient left: with call bell/phone within reach;in bed Nurse Communication: Mobility status PT Visit Diagnosis: Other abnormalities of gait and mobility (R26.89)    Time: 7564-3329 PT Time Calculation (min) (ACUTE ONLY): 13 min   Charges:   PT Evaluation $PT Eval Low Complexity: 1 Low        Kati PT, DPT Acute Rehabilitation Services Office: [phone]  Sarajane Jews 05/12/2019, 12:32 PM

## 2019-05-12 NOTE — TOC Initial Note (Signed)
Transition of Care [facility]) - Initial/Assessment Note    Patient Details  Name: Brent Thompson MRN: [id number] Date of Birth: November 29, 1960  [facility] CM/SW Contact:    Ida Rogue, LCSW Phone Number: 05/12/2019, 2:10 PM  Clinical Narrative:   Patient seen in response to physician consult re: PCP and medication needs.  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 He recognizes that having a PCP and taking medications are important for his health, and states he is willing to follow up with both.  Stated that a $3.00 copay with MATCH voucher would be affordable, and that he uses the [facility] on [address] as his pharmacy. Appointment secured at [facility] will continue to follow during the course of hospitalization.               Expected Discharge Plan: Home/Self Care Barriers to Discharge: No Barriers Identified   Patient Goals and CMS Choice Patient states their goals for this hospitalization and ongoing recovery are:: "I kow I need to take medication."      Expected Discharge Plan and Services Expected Discharge Plan: Home/Self Care In-house Referral: Clinical Social Work     Living arrangements for the past 2 months: Single Family Home                                      Prior Living Arrangements/Services Living arrangements for the past 2 months: Single Family Home Lives with:: Parents Patient language and need for interpreter reviewed:: Yes Do you feel safe going back to the place where you live?: Yes      Need for Family Participation in Patient Care: Yes (Comment) Care giver support system in place?: Yes (comment)   Criminal Activity/Legal Involvement Pertinent to Current Situation/Hospitalization: No - Comment as needed  Activities of Daily Living Home Assistive Devices/Equipment: None ADL Screening  (condition at time of admission) Patient's cognitive ability adequate to safely complete daily activities?: Yes Is the patient deaf or have difficulty hearing?: No Does the patient have difficulty seeing, even when wearing glasses/contacts?: No Does the patient have difficulty concentrating, remembering, or making decisions?: No Patient able to express need for assistance with ADLs?: Yes Does the patient have difficulty dressing or bathing?: No Independently performs ADLs?: Yes (appropriate for developmental age) Does the patient have difficulty walking or climbing stairs?: No Weakness of Legs: None Weakness of Arms/Hands: None  Permission Sought/Granted                  Emotional Assessment Appearance:: Appears stated age Attitude/Demeanor/Rapport: Engaged Affect (typically observed): Appropriate Orientation: : Oriented to Self, Oriented to Place, Oriented to Situation Alcohol / Substance Use: Not Applicable Psych Involvement: No (comment)  Admission diagnosis:  Hyperglycemia [R73.9] AKI (acute kidney injury) (HCC) [N17.9] Sepsis (HCC) [A41.9] Community acquired pneumonia of right lower lobe of lung [J18.9] Hypertension, unspecified type [I10] Sepsis, due to unspecified organism, unspecified whether acute organ dysfunction present (HCC) [A41.9] Pneumonia [J18.9] Patient Active Problem List   Diagnosis Date Noted  . Sepsis (HCC) 05/10/2019  . Pneumonia 05/10/2019  . Essential hypertension 05/10/2019  . Type 2 diabetes mellitus without complication (HCC) 05/10/2019  . AKI (acute kidney injury) (HCC) 05/10/2019  . Hypokalemia 05/10/2019   PCP:  Patient, No  Pcp Per Pharmacy:   [facility] [address] [place] [address] Phone: [phone] Fax: [phone]     Social Determinants of Health (SDOH) Interventions    Readmission Risk Interventions No flowsheet data found.

## 2019-05-13 DIAGNOSIS — E1122 Type 2 diabetes mellitus with diabetic chronic kidney disease: Secondary | ICD-10-CM

## 2019-05-13 LAB — BASIC METABOLIC PANEL
Anion gap: 12 (ref 5–15)
BUN: 21 mg/dL (ref 8–23)
CO2: 20 mmol/L — ABNORMAL LOW (ref 22–32)
Calcium: 8.8 mg/dL — ABNORMAL LOW (ref 8.9–10.3)
Chloride: 104 mmol/L (ref 98–111)
Creatinine, Ser: 2.03 mg/dL — ABNORMAL HIGH (ref 0.61–1.24)
GFR calc Af Amer: 40 mL/min — ABNORMAL LOW (ref >60)
GFR calc non Af Amer: 34 mL/min — ABNORMAL LOW (ref >60)
Glucose, Bld: 124 mg/dL — ABNORMAL HIGH (ref 70–99)
Potassium: 3.3 mmol/L — ABNORMAL LOW (ref 3.5–5.1)
Sodium: 136 mmol/L (ref 135–145)

## 2019-05-13 LAB — CBC WITH DIFFERENTIAL/PLATELET
Abs Immature Granulocytes: 0.06 10*3/uL (ref 0.00–0.07)
Basophils Absolute: 0 10*3/uL (ref 0.0–0.1)
Basophils Relative: 0 %
Eosinophils Absolute: 0 10*3/uL (ref 0.0–0.5)
Eosinophils Relative: 0 %
HCT: 29 % — ABNORMAL LOW (ref 39.0–52.0)
Hemoglobin: 9.6 g/dL — ABNORMAL LOW (ref 13.0–17.0)
Immature Granulocytes: 1 %
Lymphocytes Relative: 14 %
Lymphs Abs: 0.8 10*3/uL (ref 0.7–4.0)
MCH: 32.1 pg (ref 26.0–34.0)
MCHC: 33.1 g/dL (ref 30.0–36.0)
MCV: 97 fL (ref 80.0–100.0)
Monocytes Absolute: 0.4 10*3/uL (ref 0.1–1.0)
Monocytes Relative: 7 %
Neutro Abs: 4.2 10*3/uL (ref 1.7–7.7)
Neutrophils Relative %: 78 %
Platelets: 241 10*3/uL (ref 150–400)
RBC: 2.99 MIL/uL — ABNORMAL LOW (ref 4.22–5.81)
RDW: 14.9 % (ref 11.5–15.5)
WBC: 5.4 10*3/uL (ref 4.0–10.5)
nRBC: 0 % (ref 0.0–0.2)

## 2019-05-13 LAB — GLUCOSE, CAPILLARY: Glucose-Capillary: 112 mg/dL — ABNORMAL HIGH (ref 70–99)

## 2019-05-13 MED ORDER — LISINOPRIL 10 MG PO TABS: 10.0000 mg | ORAL_TABLET | Freq: Every day | ORAL | 0 refills | Status: DC

## 2019-05-13 MED ORDER — LISINOPRIL 10 MG PO TABS: 10.0000 mg | ORAL_TABLET | Freq: Every day | ORAL | Status: DC

## 2019-05-13 MED ORDER — METFORMIN HCL 500 MG PO TABS: 500.0000 mg | ORAL_TABLET | Freq: Every day | ORAL | 0 refills | Status: DC

## 2019-05-13 MED ORDER — BLOOD GLUCOSE METER KIT: PACK | 0 refills | Status: AC

## 2019-05-13 MED ORDER — POTASSIUM CHLORIDE CRYS ER 20 MEQ PO TBCR: 40.0000 meq | EXTENDED_RELEASE_TABLET | Freq: Once | ORAL | Status: DC

## 2019-05-13 MED ORDER — CEPHALEXIN 250 MG PO CAPS: 250.0000 mg | ORAL_CAPSULE | Freq: Two times a day (BID) | ORAL | 0 refills | Status: AC

## 2019-05-13 MED ORDER — CEPHALEXIN 250 MG PO CAPS: 250.0000 mg | ORAL_CAPSULE | Freq: Two times a day (BID) | ORAL | 0 refills | Status: DC

## 2019-05-13 MED ORDER — CEPHALEXIN 250 MG PO CAPS
250.0000 mg | ORAL_CAPSULE | Freq: Two times a day (BID) | ORAL | Status: DC
  Filled 2019-05-13: qty 1

## 2019-05-13 MED ORDER — AMOXICILLIN-POT CLAVULANATE 875-125 MG PO TABS: 1.0000 | ORAL_TABLET | Freq: Two times a day (BID) | ORAL | Status: DC

## 2019-05-13 MED ORDER — BLOOD PRESSURE KIT: 1.0000 [IU] | PACK | Freq: Every day | 0 refills | Status: AC

## 2019-05-13 MED FILL — CEPHALEXIN 250 MG CAPS: 250 | 5 days supply | Qty: 10 | Fill #0

## 2019-05-13 MED FILL — METFORMIN HCL 500 MG TABS: 500 | 30 days supply | Qty: 30 | Fill #0

## 2019-05-13 MED FILL — LISINOPRIL 10 MG TABS: 10 | 30 days supply | Qty: 30 | Fill #0

## 2019-05-13 NOTE — Discharge Summary (Addendum)
Physician Discharge Summary  Brent Thompson BJS:[id number] DOB: [birth year] DOA: 05/10/2019  PCP: Patient, No Pcp Per  Admit date: 05/10/2019 Discharge date: 05/13/2019  Admitted From: Home  Disposition:  Home   Recommendations for Outpatient Follow-up and new medication changes:  1. Follow up with Primary Care in 7 days/ [facility] 2. Patient will continue antibiotic therapy with cephalexin for 5 more days. 3. Added metformin for diabetes control 4. Started on lisinopril for blood pressure control. 5. Glucometer to check capillary glucose before breakfast and keep a log. 6. Blood pressure monitoring at home. 7. Advice for smoking and alcohol cessation.   8. Follow up renal function as outpatient.   Home Health: no  Equipment/Devices: glucometer and blood pressure monitor for use at home    Discharge Condition: stable  CODE STATUS: full  Diet recommendation: heart healthy and diabetic prudent.    Brief/Interim Summary: Patient admitted to the hospital with a working diagnosis of right lower lobe pneumonia complicated by sepsis, endorgan failure lactic acidosis and acute kidney injury.   59 year old male with hypertension and diabetes mellitus type 2 who presents with 2 weeks of not feeling well, right upper quadrant abdominal pain, fevers, chills and cough. On his physical examination he was ill looking appearing, temperature 99.9, blood pressure 188/177, respiratory rate 18, oxygen saturation 98% on room air, HR 99,his lungs had rales at the right base, heart S1-S2 present, rhythmic, soft abdomen no lower extremity edema. Sodium 129, potassium 3.2, chloride 90, bicarb 22, glucose 337, BUN 28, creatinine 2.62, anion gap 17, lactic acid 2.5, white count 9.4, hemoglobin 9.8, hematocrit 34.8, platelets 197.Urinalysis specific gravity >1.030,>300 protein,>500 glucose, 0-5 white cells, 0-5 red cells.CT of the abdomen with no acute abdominal changes, noted right  lower lobe infiltrate,groundglass. Chest radiograph with right lower lobe interstitial infiltrate.   Patient placed on IV antibiotic therapy, supplemental oxygen, bronchodilators therapy and IV fluids with improvement in his symptoms.  Patient started on antihypertensive regimen and diabetic regimen with good toleration. Will need close follow up as outpatient.   1.  Right lower lobe community-acquired pneumonia, present on admission, complicated by sepsis, end-organ failure lactic acidosis and acute kidney injury.  Patient was admitted to the medical ward, placed on a remote telemetry monitor, received antibiotic therapy with intravenous ceftriaxone and oral azithromycin with good toleration.  His cultures remain no growth.  Patient was placed on bronchodilator therapy, antitussive agents, and airway clearance techniques with incentive spirometer and flutter valve.  By the day of discharge he has been afebrile for 24 hours, his white cell count is 5.4.  His oxygenation is 98% on room air.  Patient has been transitioned to cephalexin to complete 5 more days as an outpatient.  2.  Pre-renal acute kidney injury on chronic kidney disease stage 3b/ with hyponatremia, hypokalemia, hypochloremia, non gap metabolic acidosis.  Patient received intravenous fluids with improvement of kidney function, I suspect he has chronic kidney disease with baseline creatinine of 2.0.  His electrolytes were corrected, his discharge sodium is 136, potassium 3.8, bicarb 20, BUN 21 and creatinine 2.0.  He will receive potassium chloride before discharge.  Recommend follow-up kidney function as an outpatient.  3.  Uncontrolled type 2 diabetes mellitus, hemoglobin A1c 8.1 Patient received insulin therapy while hospitalized, sliding scale and basal of 10 units of levemir, with good toleration.  His discharge fasting glucose is 124.  Patient has been started on Metformin, will need aggressive lifestyle modifications and a close  follow-up  as outpatient.  A glucometer will be prescribed for capillary glucose monitor at home.   4.  Hypertension, uncontrolled.  Patient was placed on amlodipine during his hospitalization with good blood pressure control.  Now that his kidney function is at baseline patient will be placed on ACE inhibitor, lisinopril 10 mg and follow up as outpatient.  Continue blood pressure monitor at home.  5.  Alcohol and tobacco abuse.  Patient received counseling about alcohol and tobacco cessation.  Discharge Diagnoses:  Principal Problem:   Pneumonia Active Problems:   Sepsis ([place])   Essential hypertension   Type 2 diabetes mellitus without complication ([place])   AKI (acute kidney injury) ([place])   Hypokalemia   CKD stage 3 secondary to diabetes [facility])    Discharge Instructions   Allergies as of 05/13/2019   No Known Allergies     Medication List    TAKE these medications   blood glucose meter kit and supplies Dispense based on patient and insurance preference. Use up to four times daily as directed. (FOR ICD-10 E10.9, E11.9).   Blood Pressure Kit 1 Units by Does not apply route daily. Please check blood pressure every morning and keep a log.   cephALEXin 250 MG capsule Commonly known as: KEFLEX Take 1 capsule (250 mg total) by mouth every 12 (twelve) hours for 5 days.   lisinopril 10 MG tablet Commonly known as: ZESTRIL Take 1 tablet (10 mg total) by mouth daily. Start taking on: May 14, 2019   metFORMIN 500 MG tablet Commonly known as: GLUCOPHAGE Take 1 tablet (500 mg total) by mouth daily with breakfast. Start taking on: May 14, 2019      [facility] Follow up on 06/01/2019.   Why: The first Tuesday in May at 2:30 for your hospital follow up appointment Contact information: [address]  [place] [address] [phone]         No Known Allergies      Procedures/Studies: CT  ABDOMEN PELVIS WO CONTRAST  Result Date: 05/10/2019 CLINICAL DATA:  Abdominal pain, constipation, nausea and fevers. EXAM: CT ABDOMEN AND PELVIS WITHOUT CONTRAST TECHNIQUE: Multidetector CT imaging of the abdomen and pelvis was performed following the standard protocol without IV contrast. COMPARISON:  03/07/2017 FINDINGS: Lower chest: Ground-glass attenuation and peribronchovascular consolidation noted involving much of the visualized portions of the right lower lobe concerning for pneumonia. Hepatobiliary: No focal liver abnormality is seen. No gallstones, gallbladder wall thickening, or biliary dilatation. Pancreas: Unremarkable. No pancreatic ductal dilatation or surrounding inflammatory changes. Spleen: Normal in size without focal abnormality. Adrenals/Urinary Tract: Normal appearance of the adrenal glands. Tiny, punctate stones identified in both kidneys, nonspecific. There is bilateral perinephric soft tissue stranding, new from previous exam. No hydronephrosis. Left kidney cyst measures 1.7 cm and is too small to characterize, image 23/2. The urinary bladder appears unremarkable. Stomach/Bowel: Stomach is within normal limits. The appendix is not confidently identified separate from the right lower quadrant bowel loops. No evidence of bowel wall thickening, distention, or inflammatory changes. Vascular/Lymphatic: Mild aortic atherosclerosis. There is no aneurysm. No abdominopelvic adenopathy identified. No pelvic or inguinal adenopathy. Reproductive: Prostate is unremarkable. Other: No free fluid or fluid collections. Small fat containing umbilical hernia. Musculoskeletal: Scoliosis and degenerative disc disease. IMPRESSION: 1. Ground-glass attenuation and peribronchovascular densities noted involving much of the visualized portions of the right lower lobe concerning for pneumonia. 2. Bilateral perinephric soft tissue stranding is new from previous exam nonspecific.  Correlate for any clinical signs or  symptoms of pyelonephritis. 3. Tiny, punctate stones are identified in both kidneys no hydronephrosis, hydroureter or ureteral calculi. 4. Aortic atherosclerosis. Aortic Atherosclerosis (ICD10-I70.0). Electronically Signed   By: Kerby Moors M.D.   On: 05/10/2019 12:04   DG Chest Port 1 View  Result Date: 05/10/2019 CLINICAL DATA:  Fever. EXAM: PORTABLE CHEST 1 VIEW COMPARISON:  May 06, 2019. FINDINGS: The heart size and mediastinal contours are within normal limits. No pneumothorax or pleural effusion is noted. Left lung is clear. Mild right basilar opacity is noted concerning for pneumonia. The visualized skeletal structures are unremarkable. IMPRESSION: Mild right basilar opacity is noted concerning for pneumonia. Followup radiographs are recommended until resolution. Electronically Signed   By: Marijo Conception M.D.   On: 05/10/2019 11:56   DG Chest Portable 1 View  Result Date: 05/06/2019 CLINICAL DATA:  Fever EXAM: PORTABLE CHEST 1 VIEW COMPARISON:  None. FINDINGS: Normal heart size and mediastinal contours. No acute infiltrate or edema. No effusion or pneumothorax. No acute osseous findings. IMPRESSION: Negative chest. Electronically Signed   By: Monte Fantasia M.D.   On: 05/06/2019 08:53        Subjective: Patient is feeling better, no nausea or vomiting, dyspnea continue to improve. No chest pain.   Discharge Exam: Vitals:   05/13/19 0529 05/13/19 0727  BP: 140/85   Pulse: 82   Resp: 17   Temp: 99 F (37.2 C)   SpO2: 96% 98%   Vitals:   05/12/19 1900 05/12/19 2013 05/13/19 0529 05/13/19 0727  BP: 140/88  140/85   Pulse: 83  82   Resp: 18  17   Temp: 99.7 F (37.6 C)  99 F (37.2 C)   TempSrc: Oral     SpO2: 98% 97% 96% 98%  Weight:      Height:        General: Not in pain or dyspnea.  Neurology: Awake and alert, non focal  E ENT: no pallor, no icterus, oral mucosa moist Cardiovascular: No JVD. S1-S2 present, rhythmic, no gallops, rubs, or murmurs. No lower  extremity edema. Pulmonary: positive breath sounds bilaterally, adequate air movement, no wheezing or rhonchi, positive right base rales. Gastrointestinal. Abdomen with no organomegaly, non tender, no rebound or guarding Skin. No rashes Musculoskeletal: no joint deformities   The results of significant diagnostics from this hospitalization (including imaging, microbiology, ancillary and laboratory) are listed below for reference.     Microbiology: Recent Results (from the past 240 hour(s))  SARS Coronavirus 2 Ag (30 min TAT) - Nasal Swab (BD Veritor Kit)     Status: None   Collection Time: 05/06/19  8:28 AM   Specimen: Nasal Swab (BD Veritor Kit)  Result Value Ref Range Status   SARS Coronavirus 2 Ag NEGATIVE NEGATIVE Final    Comment: (NOTE) SARS-CoV-2 antigen NOT DETECTED.  Negative results are presumptive.  Negative results do not preclude SARS-CoV-2 infection and should not be used as the sole basis for treatment or other patient management decisions, including infection  control decisions, particularly in the presence of clinical signs and  symptoms consistent with COVID-19, or in those who have been in contact with the virus.  Negative results must be combined with clinical observations, patient history, and epidemiological information. The expected result is Negative. Fact Sheet for Patients: PodPark.tn Fact Sheet for Healthcare Providers: GiftContent.is This test is not yet approved or cleared by the [place] FDA and  has been authorized for detection and/or  diagnosis of SARS-CoV-2 by FDA under an Emergency Use Authorization (EUA).  This EUA will remain in effect (meaning this test can be used) for the duration of  the COVID-19 de claration under Section 564(b)(1) of the Act, 21 U.S.C. section 360bbb-3(b)(1), unless the authorization is terminated or revoked sooner. Performed at [facility], [address] [place], [place] [address]   SARS CORONAVIRUS 2 (TAT 6-24 HRS) Nasopharyngeal Nasopharyngeal Swab     Status: None   Collection Time: 05/06/19  9:36 AM   Specimen: Nasopharyngeal Swab  Result Value Ref Range Status   SARS Coronavirus 2 NEGATIVE NEGATIVE Final    Comment: (NOTE) SARS-CoV-2 target nucleic acids are NOT DETECTED. The SARS-CoV-2 RNA is generally detectable in upper and lower respiratory specimens during the acute phase of infection. Negative results do not preclude SARS-CoV-2 infection, do not rule out co-infections with other pathogens, and should not be used as the sole basis for treatment or other patient management decisions. Negative results must be combined with clinical observations, patient history, and epidemiological information. The expected result is Negative. Fact Sheet for Patients: SugarRoll.be Fact Sheet for Healthcare Providers: https://www.woods-mathews.com/ This test is not yet approved or cleared by the [place] FDA and  has been authorized for detection and/or diagnosis of SARS-CoV-2 by FDA under an Emergency Use Authorization (EUA). This EUA will remain  in effect (meaning this test can be used) for the duration of the COVID-19 declaration under Section 56 4(b)(1) of the Act, 21 U.S.C. section 360bbb-3(b)(1), unless the authorization is terminated or revoked sooner. Performed at [facility], [place] [address], [place], [place] [address]   Blood Culture (routine x 2)     Status: None (Preliminary result)   Collection Time: 05/10/19 10:23 AM   Specimen: Left Antecubital; Blood  Result Value Ref Range Status   Specimen Description LEFT ANTECUBITAL  Final   Special Requests   Final    BOTTLES DRAWN AEROBIC AND ANAEROBIC Blood Culture adequate volume Performed at [facility], [address] [place], [place] [address]    Culture NO GROWTH 2 DAYS  Final   Report Status  PENDING  Incomplete  Blood Culture (routine x 2)     Status: None (Preliminary result)   Collection Time: 05/10/19 11:28 AM   Specimen: BLOOD  Result Value Ref Range Status   Specimen Description BLOOD RIGHT ANTECUBITAL  Final   Special Requests   Final    BOTTLES DRAWN AEROBIC AND ANAEROBIC Blood Culture adequate volume Performed at [facility], [address] [place], [place] [address]    Culture NO GROWTH 2 DAYS  Final   Report Status PENDING  Incomplete  Urine culture     Status: None   Collection Time: 05/10/19 12:28 PM   Specimen: In/Out Cath Urine  Result Value Ref Range Status   Specimen Description   Final    IN/OUT CATH URINE Performed at [facility], [address] [place], [place] [address]    Special Requests   Final    NONE Performed at [facility], [address] [place], [place] [address]    Culture   Final    NO GROWTH Performed at [facility], [place] [address] [place], [place] [address]    Report Status 05/11/2019 FINAL  Final  SARS CORONAVIRUS 2 (TAT 6-24 HRS) Nasopharyngeal Nasopharyngeal Swab     Status: None  Collection Time: 05/10/19 12:54 PM   Specimen: Nasopharyngeal Swab  Result Value Ref Range Status   SARS Coronavirus 2 NEGATIVE NEGATIVE Final    Comment: (NOTE) SARS-CoV-2 target nucleic acids are NOT DETECTED. The SARS-CoV-2 RNA is generally detectable in upper and lower respiratory specimens during the acute phase of infection. Negative results do not preclude SARS-CoV-2 infection, do not rule out co-infections with other pathogens, and should not be used as the sole basis for treatment or other patient management decisions. Negative results must be combined with clinical observations, patient history, and epidemiological information. The expected result is Negative. Fact Sheet for Patients: SugarRoll.be Fact Sheet for Healthcare  Providers: https://www.woods-mathews.com/ This test is not yet approved or cleared by the [place] FDA and  has been authorized for detection and/or diagnosis of SARS-CoV-2 by FDA under an Emergency Use Authorization (EUA). This EUA will remain  in effect (meaning this test can be used) for the duration of the COVID-19 declaration under Section 56 4(b)(1) of the Act, 21 U.S.C. section 360bbb-3(b)(1), unless the authorization is terminated or revoked sooner. Performed at [facility], [place] [address], [place], [place] [address]      Labs: BNP (last 3 results) No results for input(s): BNP in the last 8760 hours. Basic Metabolic Panel: Recent Labs  Lab 05/10/19 1049 05/11/19 0608 05/12/19 0558 05/13/19 0637  NA 129* 133* 131* 136  K 3.2* 3.1* 3.0* 3.3*  CL 90* 100 101 104  CO2 22 21* 21* 20*  GLUCOSE 337* 284* 105* 124*  BUN 28* 29* 23 21  CREATININE 2.62* 2.43* 2.20* 2.03*  CALCIUM 9.3 8.6* 8.6* 8.8*  MG  --  1.9  --   --    Liver Function Tests: Recent Labs  Lab 05/10/19 1049  AST 170*  ALT 85*  ALKPHOS 68  BILITOT 0.9  PROT 8.5*  ALBUMIN 3.3*   No results for input(s): LIPASE, AMYLASE in the last 168 hours. No results for input(s): AMMONIA in the last 168 hours. CBC: Recent Labs  Lab 05/10/19 1049 05/11/19 0608 05/12/19 0558 05/13/19 0637  WBC 9.4 8.0 6.1 5.4  NEUTROABS 8.1*  --  4.7 4.2  HGB 11.8* 9.7* 9.2* 9.6*  HCT 34.8* 29.4* 27.1* 29.0*  MCV 94.6 96.7 96.1 97.0  PLT 197 185 189 241   Cardiac Enzymes: No results for input(s): CKTOTAL, CKMB, CKMBINDEX, TROPONINI in the last 168 hours. BNP: Invalid input(s): POCBNP CBG: Recent Labs  Lab 05/12/19 0727 05/12/19 1209 05/12/19 1649 05/12/19 2134 05/13/19 0751  GLUCAP 94 158* 111* 150* 112*   D-Dimer No results for input(s): DDIMER in the last 72 hours. Hgb A1c Recent Labs    05/10/19 1834  HGBA1C 8.1*   Lipid Profile No results for input(s): CHOL, HDL, LDLCALC,  TRIG, CHOLHDL, LDLDIRECT in the last 72 hours. Thyroid function studies No results for input(s): TSH, T4TOTAL, T3FREE, THYROIDAB in the last 72 hours.  Invalid input(s): FREET3 Anemia work up No results for input(s): VITAMINB12, FOLATE, FERRITIN, TIBC, IRON, RETICCTPCT in the last 72 hours. Urinalysis    Component Value Date/Time   COLORURINE YELLOW 05/10/2019 1228   APPEARANCEUR CLOUDY (A) 05/10/2019 1228   LABSPEC >1.030 (H) 05/10/2019 1228   PHURINE 6.0 05/10/2019 1228   GLUCOSEU >=500 (A) 05/10/2019 1228   HGBUR LARGE (A) 05/10/2019 1228   BILIRUBINUR NEGATIVE 05/10/2019 1228   KETONESUR 15 (A) 05/10/2019 1228   PROTEINUR >300 (A) 05/10/2019 1228   NITRITE NEGATIVE 05/10/2019 1228   LEUKOCYTESUR NEGATIVE  05/10/2019 1228   Sepsis Labs Invalid input(s): PROCALCITONIN,  WBC,  LACTICIDVEN Microbiology Recent Results (from the past 240 hour(s))  SARS Coronavirus 2 Ag (30 min TAT) - Nasal Swab (BD Veritor Kit)     Status: None   Collection Time: 05/06/19  8:28 AM   Specimen: Nasal Swab (BD Veritor Kit)  Result Value Ref Range Status   SARS Coronavirus 2 Ag NEGATIVE NEGATIVE Final    Comment: (NOTE) SARS-CoV-2 antigen NOT DETECTED.  Negative results are presumptive.  Negative results do not preclude SARS-CoV-2 infection and should not be used as the sole basis for treatment or other patient management decisions, including infection  control decisions, particularly in the presence of clinical signs and  symptoms consistent with COVID-19, or in those who have been in contact with the virus.  Negative results must be combined with clinical observations, patient history, and epidemiological information. The expected result is Negative. Fact Sheet for Patients: PodPark.tn Fact Sheet for Healthcare Providers: GiftContent.is This test is not yet approved or cleared by the [place] FDA and  has been authorized for  detection and/or diagnosis of SARS-CoV-2 by FDA under an Emergency Use Authorization (EUA).  This EUA will remain in effect (meaning this test can be used) for the duration of  the COVID-19 de claration under Section 564(b)(1) of the Act, 21 U.S.C. section 360bbb-3(b)(1), unless the authorization is terminated or revoked sooner. Performed at [facility], [address] [place], [place] [address]   SARS CORONAVIRUS 2 (TAT 6-24 HRS) Nasopharyngeal Nasopharyngeal Swab     Status: None   Collection Time: 05/06/19  9:36 AM   Specimen: Nasopharyngeal Swab  Result Value Ref Range Status   SARS Coronavirus 2 NEGATIVE NEGATIVE Final    Comment: (NOTE) SARS-CoV-2 target nucleic acids are NOT DETECTED. The SARS-CoV-2 RNA is generally detectable in upper and lower respiratory specimens during the acute phase of infection. Negative results do not preclude SARS-CoV-2 infection, do not rule out co-infections with other pathogens, and should not be used as the sole basis for treatment or other patient management decisions. Negative results must be combined with clinical observations, patient history, and epidemiological information. The expected result is Negative. Fact Sheet for Patients: SugarRoll.be Fact Sheet for Healthcare Providers: https://www.woods-mathews.com/ This test is not yet approved or cleared by the [place] FDA and  has been authorized for detection and/or diagnosis of SARS-CoV-2 by FDA under an Emergency Use Authorization (EUA). This EUA will remain  in effect (meaning this test can be used) for the duration of the COVID-19 declaration under Section 56 4(b)(1) of the Act, 21 U.S.C. section 360bbb-3(b)(1), unless the authorization is terminated or revoked sooner. Performed at [facility], [place] [address], [place], [place] [address]   Blood Culture (routine x 2)     Status: None (Preliminary result)   Collection  Time: 05/10/19 10:23 AM   Specimen: Left Antecubital; Blood  Result Value Ref Range Status   Specimen Description LEFT ANTECUBITAL  Final   Special Requests   Final    BOTTLES DRAWN AEROBIC AND ANAEROBIC Blood Culture adequate volume Performed at [facility], [address] [place], [place] [address]    Culture NO GROWTH 2 DAYS  Final   Report Status PENDING  Incomplete  Blood Culture (routine x 2)     Status: None (Preliminary result)   Collection Time: 05/10/19 11:28 AM   Specimen: BLOOD  Result Value Ref Range Status  Specimen Description BLOOD RIGHT ANTECUBITAL  Final   Special Requests   Final    BOTTLES DRAWN AEROBIC AND ANAEROBIC Blood Culture adequate volume Performed at [facility], [address] [place], [place] [address]    Culture NO GROWTH 2 DAYS  Final   Report Status PENDING  Incomplete  Urine culture     Status: None   Collection Time: 05/10/19 12:28 PM   Specimen: In/Out Cath Urine  Result Value Ref Range Status   Specimen Description   Final    IN/OUT CATH URINE Performed at [facility], [address] [place], [place] [address]    Special Requests   Final    NONE Performed at [facility], [address], [place], [place] [address]    Culture   Final    NO GROWTH Performed at [facility], [place] [address], [place], [place] [address]    Report Status 05/11/2019 FINAL  Final  SARS CORONAVIRUS 2 (TAT 6-24 HRS) Nasopharyngeal Nasopharyngeal Swab     Status: None   Collection Time: 05/10/19 12:54 PM   Specimen: Nasopharyngeal Swab  Result Value Ref Range Status   SARS Coronavirus 2 NEGATIVE NEGATIVE Final    Comment: (NOTE) SARS-CoV-2 target nucleic acids are NOT DETECTED. The SARS-CoV-2 RNA is generally detectable in upper and lower respiratory specimens during the acute phase of infection. Negative results do not preclude SARS-CoV-2 infection, do not rule out co-infections with other pathogens,  and should not be used as the sole basis for treatment or other patient management decisions. Negative results must be combined with clinical observations, patient history, and epidemiological information. The expected result is Negative. Fact Sheet for Patients: SugarRoll.be Fact Sheet for Healthcare Providers: https://www.woods-mathews.com/ This test is not yet approved or cleared by the [place] FDA and  has been authorized for detection and/or diagnosis of SARS-CoV-2 by FDA under an Emergency Use Authorization (EUA). This EUA will remain  in effect (meaning this test can be used) for the duration of the COVID-19 declaration under Section 56 4(b)(1) of the Act, 21 U.S.C. section 360bbb-3(b)(1), unless the authorization is terminated or revoked sooner. Performed at [facility], [place] [address] [place], [place] [address]      Time coordinating discharge: 45 minutes  SIGNED:   Tawni Millers, MD  [facility] 05/13/2019, 10:19 AM

## 2019-05-13 NOTE — Plan of Care (Signed)
Plan of care reviewed and discussed with the patient. 

## 2019-05-15 LAB — CULTURE, BLOOD (ROUTINE X 2)
Culture: NO GROWTH
Culture: NO GROWTH
Special Requests: ADEQUATE
Special Requests: ADEQUATE

## 2019-06-01 ENCOUNTER — Ambulatory Visit: Payer: Self-pay | Attending: Family Medicine | Admitting: Family Medicine

## 2019-06-01 ENCOUNTER — Other Ambulatory Visit: Payer: Self-pay

## 2019-06-01 ENCOUNTER — Encounter: Payer: Self-pay | Admitting: Family Medicine

## 2019-06-01 VITALS — BP 170/98 | HR 88 | Ht 68.0 in | Wt 157.6 lb

## 2019-06-01 DIAGNOSIS — N1832 Chronic kidney disease, stage 3b: Secondary | ICD-10-CM | POA: Insufficient documentation

## 2019-06-01 DIAGNOSIS — Z7984 Long term (current) use of oral hypoglycemic drugs: Secondary | ICD-10-CM | POA: Insufficient documentation

## 2019-06-01 DIAGNOSIS — I10 Essential (primary) hypertension: Secondary | ICD-10-CM

## 2019-06-01 DIAGNOSIS — E1169 Type 2 diabetes mellitus with other specified complication: Secondary | ICD-10-CM | POA: Insufficient documentation

## 2019-06-01 DIAGNOSIS — I129 Hypertensive chronic kidney disease with stage 1 through stage 4 chronic kidney disease, or unspecified chronic kidney disease: Secondary | ICD-10-CM | POA: Insufficient documentation

## 2019-06-01 DIAGNOSIS — E1122 Type 2 diabetes mellitus with diabetic chronic kidney disease: Secondary | ICD-10-CM | POA: Insufficient documentation

## 2019-06-01 DIAGNOSIS — H538 Other visual disturbances: Secondary | ICD-10-CM | POA: Insufficient documentation

## 2019-06-01 DIAGNOSIS — Z79899 Other long term (current) drug therapy: Secondary | ICD-10-CM | POA: Insufficient documentation

## 2019-06-01 DIAGNOSIS — Z7901 Long term (current) use of anticoagulants: Secondary | ICD-10-CM | POA: Insufficient documentation

## 2019-06-01 LAB — GLUCOSE, POCT (MANUAL RESULT ENTRY): POC Glucose: 126 mg/dl — AB (ref 70–99)

## 2019-06-01 MED ORDER — LISINOPRIL 10 MG PO TABS: 10.0000 mg | ORAL_TABLET | Freq: Every day | ORAL | 3 refills | Status: DC

## 2019-06-01 MED ORDER — AMLODIPINE BESYLATE 10 MG PO TABS: 10.0000 mg | ORAL_TABLET | Freq: Every day | ORAL | 3 refills | Status: DC

## 2019-06-01 MED ORDER — GLIPIZIDE 5 MG PO TABS: 5.0000 mg | ORAL_TABLET | Freq: Two times a day (BID) | ORAL | 3 refills | Status: DC

## 2019-06-01 NOTE — Progress Notes (Signed)
Subjective:  Patient ID: Brent Thompson, male    DOB: [birth year]  Age: 59 y.o. MRN: [id number]  CC: Hospitalization Follow-up   HPI Brent Thompson is a 59 year old male with a history of hypertension, type 2 diabetes mellitus (A1c 8.1), CKD  here to establish care after hospitalization for sepsis secondary to right lower lobe community-acquired pneumonia from 05/10/2019 - 05/13/2019.  Treated with IV antibiotics, supplemental oxygen, bronchodilators and IV fluids and also commenced on antihypertensive and diabetic medications with improvement in his symptoms.  He was subsequently discharged home on Keflex.  Today he reports doing well and denies chest pain, cough, fever or dyspnea. He complains a blood vessel popped in his R eye as he noticed blurry vision prior to his hospitalization but denies presence of pain in his right eye. His blood pressure is elevated and he endorses compliance with his lisinopril.  His chart reveals a creatinine of 2.03 and unfortunately no baseline creatinine is available to compare with. Past Medical History:  Diagnosis Date  . Diabetes mellitus without complication ([place])   . Hypertension     No past surgical history on file.  No family history on file.  No Known Allergies  Outpatient Medications Prior to Visit  Medication Sig Dispense Refill  . blood glucose meter kit and supplies Dispense based on patient and insurance preference. Use up to four times daily as directed. (FOR ICD-10 E10.9, E11.9). 1 each 0  . Blood Pressure KIT 1 Units by Does not apply route daily. Please check blood pressure every morning and keep a log. 1 kit 0  . lisinopril (ZESTRIL) 10 MG tablet Take 1 tablet (10 mg total) by mouth daily. 30 tablet 0  . metFORMIN (GLUCOPHAGE) 500 MG tablet Take 1 tablet (500 mg total) by mouth daily with breakfast. 30 tablet 0   No facility-administered medications prior to visit.     ROS Review of Systems  Constitutional: Negative for activity  change and appetite change.  HENT: Negative for sinus pressure and sore throat.   Eyes: Positive for visual disturbance.  Respiratory: Negative for cough, chest tightness and shortness of breath.   Cardiovascular: Negative for chest pain and leg swelling.  Gastrointestinal: Negative for abdominal distention, abdominal pain, constipation and diarrhea.  Endocrine: Negative.   Genitourinary: Negative for dysuria.  Musculoskeletal: Negative for joint swelling and myalgias.  Skin: Negative for rash.  Allergic/Immunologic: Negative.   Neurological: Negative for weakness, light-headedness and numbness.  Psychiatric/Behavioral: Negative for dysphoric mood and suicidal ideas.    Objective:  BP (!) 170/98   Pulse 88   Ht _0  (1.727 m)   Wt 157 lb 9.6 oz (71.5 kg)   SpO2 98%   BMI 23.96 kg/m   BP/Weight 06/01/2019 05/13/2019 02/27/8655  Systolic BP 846 962 -  Diastolic BP 98 85 -  Wt. (Lbs) 157.6 - 145.5  BMI 23.96 - 22.12      Physical Exam Constitutional:      Appearance: He is well-developed.  Eyes:     Comments: Immature cataract in right eye  Neck:     Vascular: No JVD.  Cardiovascular:     Rate and Rhythm: Normal rate.     Heart sounds: Normal heart sounds. No murmur.  Pulmonary:     Effort: Pulmonary effort is normal.     Breath sounds: Normal breath sounds. No wheezing or rales.  Chest:     Chest wall: No tenderness.  Abdominal:     General: Bowel sounds are normal.  There is no distension.     Palpations: Abdomen is soft. There is no mass.     Tenderness: There is no abdominal tenderness.  Musculoskeletal:        General: Normal range of motion.     Right lower leg: No edema.     Left lower leg: No edema.  Neurological:     Mental Status: He is alert and oriented to person, place, and time.  Psychiatric:        Mood and Affect: Mood normal.     CMP Latest Ref Rng & Units 05/13/2019 05/12/2019 05/11/2019  Glucose 70 - 99 mg/dL 124(H) 105(H) 284(H)  BUN 8 - 23  mg/dL 21 23 29(H)  Creatinine 0.61 - 1.24 mg/dL 2.03(H) 2.20(H) 2.43(H)  Sodium 135 - 145 mmol/L 136 131(L) 133(L)  Potassium 3.5 - 5.1 mmol/L 3.3(L) 3.0(L) 3.1(L)  Chloride 98 - 111 mmol/L 104 101 100  CO2 22 - 32 mmol/L 20(L) 21(L) 21(L)  Calcium 8.9 - 10.3 mg/dL 8.8(L) 8.6(L) 8.6(L)  Total Protein 6.5 - 8.1 g/dL - - -  Total Bilirubin 0.3 - 1.2 mg/dL - - -  Alkaline Phos 38 - 126 U/L - - -  AST 15 - 41 U/L - - -  ALT 0 - 44 U/L - - -    Lipid Panel  No results found for: CHOL, TRIG, HDL, CHOLHDL, VLDL, LDLCALC, LDLDIRECT  CBC    Component Value Date/Time   WBC 5.4 05/13/2019 0637   RBC 2.99 (L) 05/13/2019 0637   HGB 9.6 (L) 05/13/2019 0637   HCT 29.0 (L) 05/13/2019 0637   PLT 241 05/13/2019 0637   MCV 97.0 05/13/2019 0637   MCH 32.1 05/13/2019 0637   MCHC 33.1 05/13/2019 0637   RDW 14.9 05/13/2019 0637   LYMPHSABS 0.8 05/13/2019 0637   MONOABS 0.4 05/13/2019 0637   EOSABS 0.0 05/13/2019 0637   BASOSABS 0.0 05/13/2019 0637    Lab Results  Component Value Date   HGBA1C 8.1 (H) 05/10/2019    Assessment & Plan:  1. Type 2 diabetes mellitus with other specified complication, without long-term current use of insulin (HCC) Uncontrolled with A1c of 8.1 I have discontinued Metformin due to GFR of 40 Glipizide has been started Counseled on Diabetic diet, my plate method, 498 minutes of moderate intensity exercise/week Blood sugar logs with fasting goals of 80-120 mg/dl, random of less than 180 and in the event of sugars less than 60 mg/dl or greater than 400 mg/dl encouraged to notify the clinic. Advised on the need for annual eye exams, annual foot exams, Pneumonia vaccine. - POCT glucose (manual entry) - CMP14+EGFR - glipiZIDE (GLUCOTROL) 5 MG tablet; Take 1 tablet (5 mg total) by mouth 2 (two) times daily before a meal.  Dispense: 60 tablet; Refill: 3  2. 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 Blurry vision, right eye Likely secondary to cataract Advised he would need to see an ophthalmologist Unfortunately he has no medical coverage for ophthalmology referral and I have discussed with him resources in the community for evaluation including the vision center at [facility]  4. Essential hypertension Uncontrolled Amlodipine added to regimen Counseled on blood pressure goal of less than 130/80, low-sodium, DASH diet, medication compliance, 150 minutes of moderate intensity exercise per week. Discussed medication compliance, adverse effects. - amLODipine (NORVASC) 10 MG tablet; Take 1 tablet (10 mg total) by  mouth daily.  Dispense: 30 tablet; Refill: 3 - lisinopril (ZESTRIL) 10 MG tablet; Take 1 tablet (10 mg total) by mouth daily.  Dispense: 30 tablet; Refill: 3   Return in about 3 months (around 09/01/2019) for Chronic disease management.    Charlott Rakes, MD, FAAFP. [facility] and [place], [place]   06/01/2019, 3:22 PM

## 2019-06-01 NOTE — Patient Instructions (Signed)

## 2019-06-02 LAB — CMP14+EGFR
ALT: 23 IU/L (ref 0–44)
AST: 32 IU/L (ref 0–40)
Albumin/Globulin Ratio: 1.2 (ref 1.2–2.2)
Albumin: 3.7 g/dL — ABNORMAL LOW (ref 3.8–4.8)
Alkaline Phosphatase: 70 IU/L (ref 39–117)
BUN/Creatinine Ratio: 8 — ABNORMAL LOW (ref 10–24)
BUN: 15 mg/dL (ref 8–27)
Bilirubin Total: 0.3 mg/dL (ref 0.0–1.2)
CO2: 25 mmol/L (ref 20–29)
Calcium: 8.8 mg/dL (ref 8.6–10.2)
Chloride: 106 mmol/L (ref 96–106)
Creatinine, Ser: 1.79 mg/dL — ABNORMAL HIGH (ref 0.76–1.27)
GFR calc Af Amer: 46 mL/min/{1.73_m2} — ABNORMAL LOW (ref >59)
GFR calc non Af Amer: 40 mL/min/{1.73_m2} — ABNORMAL LOW (ref >59)
Globulin, Total: 3 g/dL (ref 1.5–4.5)
Glucose: 127 mg/dL — ABNORMAL HIGH (ref 65–99)
Potassium: 4.4 mmol/L (ref 3.5–5.2)
Sodium: 143 mmol/L (ref 134–144)
Total Protein: 6.7 g/dL (ref 6.0–8.5)

## 2019-06-03 ENCOUNTER — Telehealth: Payer: Self-pay

## 2019-06-03 NOTE — Telephone Encounter (Signed)
-----   Message from Enobong Newlin, MD sent at 06/02/2019  8:06 AM EDT ----- Kidney function is abnormal but this has improved from hospitalization 

## 2019-06-03 NOTE — Telephone Encounter (Signed)
Patient was called to go over lab results. Patient does not have a voicemail set up to leave a message.

## 2019-06-11 ENCOUNTER — Telehealth: Payer: Self-pay

## 2019-06-11 NOTE — Telephone Encounter (Signed)
-----   Message from Hoy Register, MD sent at 06/02/2019  8:06 AM EDT ----- Kidney function is abnormal but this has improved from hospitalization

## 2019-06-11 NOTE — Telephone Encounter (Signed)
Patient was called and a voicemail was left informing patient to return phone call for lab results.  Letter has been mailed out to patient  

## 2019-06-21 ENCOUNTER — Ambulatory Visit: Payer: Medicaid Other | Attending: Internal Medicine

## 2019-06-21 DIAGNOSIS — Z23 Encounter for immunization: Secondary | ICD-10-CM

## 2019-06-21 NOTE — Progress Notes (Signed)
   Covid-19 Vaccination Clinic  Name:  Brent Thompson    MRN: [id number] DOB: 1960/07/27  06/21/2019  [name] was observed post Covid-19 immunization for 15 minutes without incident. He was provided with Vaccine Information Sheet and instruction to access the V-Safe system.   [name] was instructed to call 911 with any severe reactions post vaccine: Marland Kitchen Difficulty breathing  . Swelling of face and throat  . A fast heartbeat  . A bad rash all over body  . Dizziness and weakness   Immunizations Administered    Name Date Dose VIS Date Route   Pfizer COVID-19 Vaccine 06/21/2019 12:13 PM 0.3 mL 03/24/2018 Intramuscular   Manufacturer: [employer], [employer]   Lot: [id number]   NDC: [address]-1

## 2019-07-19 ENCOUNTER — Ambulatory Visit: Payer: Medicaid Other | Attending: Internal Medicine

## 2019-07-19 DIAGNOSIS — Z23 Encounter for immunization: Secondary | ICD-10-CM

## 2019-07-19 NOTE — Progress Notes (Signed)
   Covid-19 Vaccination Clinic  Name:  Brent Thompson    MRN: [id number] DOB: 1960/03/25  07/19/2019  Mr. Brent Thompson was observed post Covid-19 immunization for 15 minutes without incident. He was provided with Vaccine Information Sheet and instruction to access the V-Safe system.   Mr. Brent Thompson was instructed to call 911 with any severe reactions post vaccine: Marland Kitchen Difficulty breathing  . Swelling of face and throat  . A fast heartbeat  . A bad rash all over body  . Dizziness and weakness   Immunizations Administered    Name Date Dose VIS Date Route   Pfizer COVID-19 Vaccine 07/19/2019  8:22 AM 0.3 mL 03/24/2018 Intramuscular   Manufacturer: [employer], [employer]   Lot: [id number]   NDC: [address]-8

## 2019-09-02 ENCOUNTER — Ambulatory Visit: Payer: Medicaid Other | Admitting: Family Medicine

## 2019-09-11 ENCOUNTER — Other Ambulatory Visit: Payer: Self-pay | Admitting: Family Medicine

## 2019-09-11 DIAGNOSIS — I10 Essential (primary) hypertension: Secondary | ICD-10-CM

## 2019-09-11 DIAGNOSIS — E1169 Type 2 diabetes mellitus with other specified complication: Secondary | ICD-10-CM

## 2019-09-11 NOTE — Telephone Encounter (Signed)
°  Notes to clinic  No PCP listed, does this pt belong in your practice.

## 2019-10-20 ENCOUNTER — Ambulatory Visit: Payer: Medicaid Other | Admitting: Family Medicine

## 2020-02-07 ENCOUNTER — Other Ambulatory Visit: Payer: Self-pay

## 2020-02-07 ENCOUNTER — Ambulatory Visit: Payer: Self-pay | Attending: Family Medicine | Admitting: Family Medicine

## 2020-02-07 ENCOUNTER — Encounter: Payer: Self-pay | Admitting: Family Medicine

## 2020-02-07 VITALS — BP 240/120 | HR 82 | Ht 68.0 in | Wt 174.0 lb

## 2020-02-07 DIAGNOSIS — I10 Essential (primary) hypertension: Secondary | ICD-10-CM

## 2020-02-07 DIAGNOSIS — E1169 Type 2 diabetes mellitus with other specified complication: Secondary | ICD-10-CM

## 2020-02-07 DIAGNOSIS — Z1159 Encounter for screening for other viral diseases: Secondary | ICD-10-CM

## 2020-02-07 DIAGNOSIS — N1832 Chronic kidney disease, stage 3b: Secondary | ICD-10-CM

## 2020-02-07 LAB — POCT GLYCOSYLATED HEMOGLOBIN (HGB A1C): HbA1c, POC (controlled diabetic range): 7.2 % — AB (ref 0.0–7.0)

## 2020-02-07 LAB — GLUCOSE, POCT (MANUAL RESULT ENTRY): POC Glucose: 210 mg/dl — AB (ref 70–99)

## 2020-02-07 MED ORDER — CLONIDINE HCL 0.2 MG PO TABS
0.2000 mg | ORAL_TABLET | Freq: Once | ORAL | Status: AC
  Administered 2020-02-07: 0.2 mg via ORAL

## 2020-02-07 MED ORDER — AMLODIPINE BESYLATE 10 MG PO TABS: ORAL_TABLET | ORAL | 6 refills | Status: DC

## 2020-02-07 MED ORDER — ATORVASTATIN CALCIUM 20 MG PO TABS: 20.0000 mg | ORAL_TABLET | Freq: Every day | ORAL | 6 refills | Status: DC

## 2020-02-07 MED ORDER — LISINOPRIL 10 MG PO TABS: ORAL_TABLET | ORAL | 6 refills | Status: DC

## 2020-02-07 MED ORDER — GLIPIZIDE 5 MG PO TABS: ORAL_TABLET | ORAL | 6 refills | Status: DC

## 2020-02-07 NOTE — Progress Notes (Signed)
Virtual Visit via Video Note  I connected with [name], on 02/07/2020 at 3:55 PM by video enabled telemedicine device due to the COVID-19 pandemic and verified that I am speaking with the correct person using two identifiers.   Consent: I discussed the limitations, risks, security and privacy concerns of performing an evaluation and management service by telemedicine and the availability of in person appointments. I also discussed with the patient that there may be a patient responsible charge related to this service. The patient expressed understanding and agreed to proceed.   Location of Patient: Clinic  Location of Provider: Home   Persons participating in Telemedicine visit: Geral Coker [place]-CMA Dr. Margarita Rana     History of Present Illness: Brent Thompson is a 60 year old male with a history of hypertension, type 2 diabetes mellitus (A1c 7.2), CKD stage 3 last seen in the clinic in 05/2019 here for a follow up visit.  His BP is severely elevated at 240/120. He has no chest pain, headache, dyspnea but has blurry vision. He has complained previously of R eye blurry vision but now has it in both eyes. He informs me he was referred to Ophthalmology and he plans to see them this week Has been out of medications for 3 months.  A1c  Is 7.2 down from 8.1. he does not check his sugars at home. Hd denies presence of neuropathy or hypoglycemia. He does have stage 3 CKD and is not followed by Nephrology.  Past Medical History:  Diagnosis Date  . Diabetes mellitus without complication ([place])   . Hypertension    No Known Allergies  Current Outpatient Medications on File Prior to Visit  Medication Sig Dispense Refill  . amLODipine (NORVASC) 10 MG tablet TAKE 1 TABLET(10 MG) BY MOUTH DAILY 30 tablet 2  . blood glucose meter kit and supplies Dispense based on patient and insurance preference. Use up to four times daily as directed. (FOR ICD-10 E10.9, E11.9). 1 each 0  .  Blood Pressure KIT 1 Units by Does not apply route daily. Please check blood pressure every morning and keep a log. 1 kit 0  . glipiZIDE (GLUCOTROL) 5 MG tablet TAKE 1 TABLET(5 MG) BY MOUTH TWICE DAILY BEFORE A MEAL 60 tablet 2  . lisinopril (ZESTRIL) 10 MG tablet TAKE 1 TABLET(10 MG) BY MOUTH DAILY 30 tablet 2  . metFORMIN (GLUCOPHAGE) 500 MG tablet Take 1 tablet (500 mg total) by mouth daily with breakfast. 30 tablet 0   No current facility-administered medications on file prior to visit.    Observations/Objective: Today's Vitals   02/07/20 1541  BP: (!) 240/120  Pulse: 82  SpO2: 99%  Weight: 174 lb (78.9 kg)  Height: _0  (1.727 m)   Body mass index is 26.46 kg/m.   Awake, alert, oriented x3 Not in acute distress Mood: normal   Lab Results  Component Value Date   HGBA1C 7.2 (A) 02/07/2020    CMP Latest Ref Rng & Units 06/01/2019 05/13/2019 05/12/2019  Glucose 65 - 99 mg/dL 127(H) 124(H) 105(H)  BUN 8 - 27 mg/dL _1 Creatinine 0.76 - 1.27 mg/dL 1.79(H) 2.03(H) 2.20(H)  Sodium 134 - 144 mmol/L 143 136 131(L)  Potassium 3.5 - 5.2 mmol/L 4.4 3.3(L) 3.0(L)  Chloride 96 - 106 mmol/L 106 104 101  CO2 20 - 29 mmol/L 25 20(L) 21(L)  Calcium 8.6 - 10.2 mg/dL 8.8 8.8(L) 8.6(L)  Total Protein 6.0 - 8.5 g/dL 6.7 - -  Total Bilirubin 0.0 - 1.2  mg/dL 0.3 - -  Alkaline Phos 39 - 117 IU/L 70 - -  AST 0 - 40 IU/L 32 - -  ALT 0 - 44 IU/L 23 - -    Assessment and Plan: 1. Accelerated hypertension Severely elevated with risk of progression to end organ damage Clonidine 0.59m administered and BP repeated after 45 minutes Refilled prescriptions and emphasized the need to be compliant Counseled on blood pressure goal of less than 130/80, low-sodium, DASH diet, medication compliance, 150 minutes of moderate intensity exercise per week. Discussed medication compliance, adverse effects. - amLODipine (NORVASC) 10 MG tablet; TAKE 1 TABLET(10 MG) BY MOUTH DAILY  Dispense: 30 tablet;  Refill: 6 - lisinopril (ZESTRIL) 10 MG tablet; TAKE 1 TABLET(10 MG) BY MOUTH DAILY  Dispense: 30 tablet; Refill: 6 - cloNIDine (CATAPRES) tablet 0.2 mg  2. Type 2 diabetes mellitus with other specified complication, without long-term current use of insulin (HCC) Controlled with A1c of 7.2 Will hold off on Metformin in the event that renal function has deteriorated since last visit If hypoglycemic consider decreasing Glipizide dose - POCT glucose (manual entry) - POCT glycosylated hemoglobin (Hb A1C) - glipiZIDE (GLUCOTROL) 5 MG tablet; TAKE 1 TABLET(5 MG) BY MOUTH TWICE DAILY BEFORE A MEAL  Dispense: 60 tablet; Refill: 6 - atorvastatin (LIPITOR) 20 MG tablet; Take 1 tablet (20 mg total) by mouth daily.  Dispense: 30 tablet; Refill: 6  3. Need for hepatitis C screening test - HCV RNA quant rflx ultra or genotyp([facility])  4. Stage 3b chronic kidney disease ([place] Combination of Hypertensive and Diabetic Nephropathy Avoid nephrotoxins Optimization of underlying medical conditions will be beneficial - CMP14+EGFR   Follow Up Instructions: Return in about 1 month (around 03/09/2020) for Hypertension.    I discussed the assessment and treatment plan with the patient. The patient was provided an opportunity to ask questions and all were answered. The patient agreed with the plan and demonstrated an understanding of the instructions.   The patient was advised to call back or seek an in-person evaluation if the symptoms worsen or if the condition fails to improve as anticipated.     I provided 45 minutes total of Telehealth time during this encounter including median intraservice time, reviewing previous notes, investigations, ordering medications, medical decision making, coordinating care and patient verbalized understanding at the end of the visit.     ECharlott Rakes MD, FAAFP. [facility] [place]  02/07/2020, 3:55  PM

## 2020-02-07 NOTE — Progress Notes (Signed)
Has not had any medication in 3 months.

## 2020-02-07 NOTE — Patient Instructions (Signed)

## 2020-02-09 LAB — CMP14+EGFR
ALT: 42 IU/L (ref 0–44)
AST: 23 IU/L (ref 0–40)
Albumin/Globulin Ratio: 1.5 (ref 1.2–2.2)
Albumin: 4.1 g/dL (ref 3.8–4.8)
Alkaline Phosphatase: 47 IU/L (ref 44–121)
BUN/Creatinine Ratio: 12 (ref 10–24)
BUN: 20 mg/dL (ref 8–27)
Bilirubin Total: <0.2 mg/dL (ref 0.0–1.2)
CO2: 24 mmol/L (ref 20–29)
Calcium: 9.4 mg/dL (ref 8.6–10.2)
Chloride: 104 mmol/L (ref 96–106)
Creatinine, Ser: 1.72 mg/dL — ABNORMAL HIGH (ref 0.76–1.27)
GFR calc Af Amer: 49 mL/min/{1.73_m2} — ABNORMAL LOW (ref >59)
GFR calc non Af Amer: 42 mL/min/{1.73_m2} — ABNORMAL LOW (ref >59)
Globulin, Total: 2.7 g/dL (ref 1.5–4.5)
Glucose: 170 mg/dL — ABNORMAL HIGH (ref 65–99)
Potassium: 4.3 mmol/L (ref 3.5–5.2)
Sodium: 140 mmol/L (ref 134–144)
Total Protein: 6.8 g/dL (ref 6.0–8.5)

## 2020-02-09 LAB — HCV RNA QUANT RFLX ULTRA OR GENOTYP: HCV Quant Baseline: NOT DETECTED IU/mL

## 2020-02-10 ENCOUNTER — Telehealth: Payer: Self-pay

## 2020-02-10 NOTE — Telephone Encounter (Signed)
Patient name and DOB has been verified Patient was informed of lab results. Patient had no questions.  

## 2020-02-10 NOTE — Telephone Encounter (Signed)
-----   Message from Hoy Register, MD sent at 02/09/2020  5:07 PM EST ----- Kidney function is abnormal but stable compared to previous labs. Please advise abstain from medications like Aleve, Ibuprofen

## 2020-03-13 ENCOUNTER — Encounter: Payer: Self-pay | Admitting: Family Medicine

## 2020-03-13 ENCOUNTER — Other Ambulatory Visit: Payer: Self-pay | Admitting: Pharmacy Technician

## 2020-03-13 ENCOUNTER — Ambulatory Visit: Payer: Self-pay | Attending: Family Medicine | Admitting: Family Medicine

## 2020-03-13 ENCOUNTER — Other Ambulatory Visit: Payer: Self-pay

## 2020-03-13 ENCOUNTER — Other Ambulatory Visit: Payer: Self-pay | Admitting: Family Medicine

## 2020-03-13 DIAGNOSIS — E1169 Type 2 diabetes mellitus with other specified complication: Secondary | ICD-10-CM

## 2020-03-13 DIAGNOSIS — I10 Essential (primary) hypertension: Secondary | ICD-10-CM

## 2020-03-13 MED ORDER — GLIPIZIDE 5 MG PO TABS: ORAL_TABLET | ORAL | 6 refills | Status: DC

## 2020-03-13 MED ORDER — ATORVASTATIN CALCIUM 20 MG PO TABS: 20.0000 mg | ORAL_TABLET | Freq: Every day | ORAL | 6 refills | Status: DC

## 2020-03-13 MED ORDER — LISINOPRIL 10 MG PO TABS
ORAL_TABLET | ORAL | 6 refills | Status: DC
Start: 2020-03-13 — End: 2020-03-27

## 2020-03-13 MED ORDER — AMLODIPINE BESYLATE 10 MG PO TABS
ORAL_TABLET | ORAL | 6 refills | Status: DC
Start: 2020-03-13 — End: 2021-10-20

## 2020-03-13 MED FILL — AMLODIPINE BESYLATE 10 MG T: 10 | 30 days supply | Qty: 30 | Fill #0

## 2020-03-13 MED FILL — ATORVASTATIN CALCIUM 20 MG: 20 | 30 days supply | Qty: 30 | Fill #0

## 2020-03-13 MED FILL — LISINOPRIL 10 MG TABS: 10 | 30 days supply | Qty: 30 | Fill #0

## 2020-03-13 MED FILL — glipiZIDE 5 MG TABS: 5 | 30 days supply | Qty: 30 | Fill #0

## 2020-03-13 NOTE — Progress Notes (Signed)
Subjective:  Patient ID: Brent Thompson, male    DOB: 1960/11/21  Age: 60 y.o. MRN: [id number]  CC: Hypertension   HPI Brent Thompson is a 60 year old male with a history of type 2 diabetes mellitus (A1c 7.2), hypertension, stage III chronic kidney disease who presents today for follow-up of his hypertension. At his last office visit, his blood pressure was 240/120 as he had been out of medications for 3 months and clonidine was administered.  He ran out of all his medications 2 days ago hence his elevated BP today.  Denies presence of chest pain, dyspnea, blurry vision or headaches. Past Medical History:  Diagnosis Date  . Diabetes mellitus without complication ([place])   . Hypertension     No past surgical history on file.  No family history on file.  No Known Allergies  Outpatient Medications Prior to Visit  Medication Sig Dispense Refill  . blood glucose meter kit and supplies Dispense based on patient and insurance preference. Use up to four times daily as directed. (FOR ICD-10 E10.9, E11.9). 1 each 0  . Blood Pressure KIT 1 Units by Does not apply route daily. Please check blood pressure every morning and keep a log. 1 kit 0  . amLODipine (NORVASC) 10 MG tablet TAKE 1 TABLET(10 MG) BY MOUTH DAILY 30 tablet 6  . atorvastatin (LIPITOR) 20 MG tablet Take 1 tablet (20 mg total) by mouth daily. 30 tablet 6  . glipiZIDE (GLUCOTROL) 5 MG tablet TAKE 1 TABLET(5 MG) BY MOUTH TWICE DAILY BEFORE A MEAL 60 tablet 6  . lisinopril (ZESTRIL) 10 MG tablet TAKE 1 TABLET(10 MG) BY MOUTH DAILY 30 tablet 6  . metFORMIN (GLUCOPHAGE) 500 MG tablet Take 1 tablet (500 mg total) by mouth daily with breakfast. 30 tablet 0   No facility-administered medications prior to visit.     ROS Review of Systems  Constitutional: Negative for activity change and appetite change.  HENT: Negative for sinus pressure and sore throat.   Eyes: Negative for visual disturbance.  Respiratory: Negative for cough, chest  tightness and shortness of breath.   Cardiovascular: Negative for chest pain and leg swelling.  Gastrointestinal: Negative for abdominal distention, abdominal pain, constipation and diarrhea.  Endocrine: Negative.   Genitourinary: Negative for dysuria.  Musculoskeletal: Negative for joint swelling and myalgias.  Skin: Negative for rash.  Allergic/Immunologic: Negative.   Neurological: Negative for weakness, light-headedness and numbness.  Psychiatric/Behavioral: Negative for dysphoric mood and suicidal ideas.    Objective:  BP (!) 186/93   Pulse 62   Ht '5\' 8"'  (1.727 m)   Wt 169 lb (76.7 kg)   SpO2 100%   BMI 25.70 kg/m   BP/Weight 03/13/2020 7/94/3276 01/31/7090  Systolic BP 957 473 403  Diastolic BP 93 709 98  Wt. (Lbs) 169 174 157.6  BMI 25.7 26.46 23.96      Physical Exam Constitutional:      Appearance: He is well-developed.  Neck:     Vascular: No JVD.  Cardiovascular:     Rate and Rhythm: Normal rate.     Heart sounds: Normal heart sounds. No murmur heard.   Pulmonary:     Effort: Pulmonary effort is normal.     Breath sounds: Normal breath sounds. No wheezing or rales.  Chest:     Chest wall: No tenderness.  Abdominal:     General: Bowel sounds are normal. There is no distension.     Palpations: Abdomen is soft. There is no mass.  Tenderness: There is no abdominal tenderness.  Musculoskeletal:        General: Normal range of motion.     Right lower leg: No edema.     Left lower leg: No edema.  Neurological:     Mental Status: He is alert and oriented to person, place, and time.  Psychiatric:        Mood and Affect: Mood normal.     CMP Latest Ref Rng & Units 02/07/2020 06/01/2019 05/13/2019  Glucose 65 - 99 mg/dL 170(H) 127(H) 124(H)  BUN 8 - 27 mg/dL '20 15 21  ' Creatinine 0.76 - 1.27 mg/dL 1.72(H) 1.79(H) 2.03(H)  Sodium 134 - 144 mmol/L 140 143 136  Potassium 3.5 - 5.2 mmol/L 4.3 4.4 3.3(L)  Chloride 96 - 106 mmol/L 104 106 104  CO2 20 - 29  mmol/L 24 25 20(L)  Calcium 8.6 - 10.2 mg/dL 9.4 8.8 8.8(L)  Total Protein 6.0 - 8.5 g/dL 6.8 6.7 -  Total Bilirubin 0.0 - 1.2 mg/dL <0.2 0.3 -  Alkaline Phos 44 - 121 IU/L 47 70 -  AST 0 - 40 IU/L 23 32 -  ALT 0 - 44 IU/L 42 23 -    Lipid Panel  No results found for: CHOL, TRIG, HDL, CHOLHDL, VLDL, LDLCALC, LDLDIRECT  CBC    Component Value Date/Time   WBC 5.4 05/13/2019 0637   RBC 2.99 (L) 05/13/2019 0637   HGB 9.6 (L) 05/13/2019 0637   HCT 29.0 (L) 05/13/2019 0637   PLT 241 05/13/2019 0637   MCV 97.0 05/13/2019 0637   MCH 32.1 05/13/2019 0637   MCHC 33.1 05/13/2019 0637   RDW 14.9 05/13/2019 0637   LYMPHSABS 0.8 05/13/2019 0637   MONOABS 0.4 05/13/2019 0637   EOSABS 0.0 05/13/2019 0637   BASOSABS 0.0 05/13/2019 1505    Lab Results  Component Value Date   HGBA1C 7.2 (A) 02/07/2020    Assessment & Plan:  1. Accelerated hypertension Due to running out of antihypertensives I have advised him to pick up his medications today and he will see the clinical pharmacist next week.  If blood pressure is above goal consider increasing lisinopril to 20 mg and I will recommend a basic metabolic check in 1 week. Counseled on blood pressure goal of less than 130/80, low-sodium, DASH diet, medication compliance, 150 minutes of moderate intensity exercise per week. Discussed medication compliance, adverse effects. - amLODipine (NORVASC) 10 MG tablet; TAKE 1 TABLET(10 MG) BY MOUTH DAILY  Dispense: 30 tablet; Refill: 6 - lisinopril (ZESTRIL) 10 MG tablet; TAKE 1 TABLET(10 MG) BY MOUTH DAILY  Dispense: 30 tablet; Refill: 6  2. Type 2 diabetes mellitus with other specified complication, without long-term current use of insulin (HCC) Controlled with A1c of 7.2 - atorvastatin (LIPITOR) 20 MG tablet; Take 1 tablet (20 mg total) by mouth daily.  Dispense: 30 tablet; Refill: 6 - glipiZIDE (GLUCOTROL) 5 MG tablet; TAKE 1 TABLET(5 MG) BY MOUTH TWICE DAILY BEFORE A MEAL  Dispense: 60 tablet;  Refill: 6    Meds ordered this encounter  Medications  . amLODipine (NORVASC) 10 MG tablet    Sig: TAKE 1 TABLET(10 MG) BY MOUTH DAILY    Dispense:  30 tablet    Refill:  6  . atorvastatin (LIPITOR) 20 MG tablet    Sig: Take 1 tablet (20 mg total) by mouth daily.    Dispense:  30 tablet    Refill:  6  . glipiZIDE (GLUCOTROL) 5 MG tablet    Sig:  TAKE 1 TABLET(5 MG) BY MOUTH TWICE DAILY BEFORE A MEAL    Dispense:  60 tablet    Refill:  6  . lisinopril (ZESTRIL) 10 MG tablet    Sig: TAKE 1 TABLET(10 MG) BY MOUTH DAILY    Dispense:  30 tablet    Refill:  6    Follow-up: Return in about 1 week (around 03/20/2020) for BP evaluation with Lurena Joiner; 1 month with PCP.       Charlott Rakes, MD, FAAFP. [facility] and [place], [place]   03/13/2020, 12:30 PM

## 2020-03-27 ENCOUNTER — Other Ambulatory Visit: Payer: Self-pay

## 2020-03-27 ENCOUNTER — Encounter: Payer: Self-pay | Admitting: Pharmacist

## 2020-03-27 ENCOUNTER — Other Ambulatory Visit: Payer: Self-pay | Admitting: Family Medicine

## 2020-03-27 ENCOUNTER — Ambulatory Visit: Payer: Self-pay | Attending: Family Medicine | Admitting: Pharmacist

## 2020-03-27 VITALS — BP 169/84 | HR 84

## 2020-03-27 DIAGNOSIS — I10 Essential (primary) hypertension: Secondary | ICD-10-CM

## 2020-03-27 MED ORDER — LISINOPRIL 20 MG PO TABS: 20.0000 mg | ORAL_TABLET | Freq: Every day | ORAL | 3 refills | Status: DC

## 2020-03-27 MED FILL — LISINOPRIL 20 MG TABLET: 20 | 30 days supply | Qty: 30 | Fill #0

## 2020-03-27 NOTE — Progress Notes (Signed)
   S:    PCP: Dr. Alvis Lemmings  Patient arrives in goo good spirits.  Presents to the clinic for hypertension evaluation, counseling, and management. Pt with PMH significant for T2DM, HTN, CKD III.   Patient was referred and last seen by Primary Care Provider on 03/13/2020. BP at that visit was 186/93. Of note, he has a history of running out of his medications prior to his visits. At his visit with his PCP on 2/14, he endorsed being without his antihypertensives for two days prior to that visit.   Medication adherence reported. He tells me that he has not missed a dose since seeing Dr. Alvis Lemmings earlier this month.   Current BP Medications include:  Amlodipine 10 mg daily, lisinopril 10 mg daily  Antihypertensives tried in the past include: none other than those currently listed; NKDA   Dietary habits include: drinks coffee daily but did not have any this morning; compliant with salt restriction Exercise habits include: does not exercise formally but tries to stay active  Family / Social history:  - Alcohol:  - Tobacco: current every smoker (has not smoked this AM)   O:  Vitals:   03/27/20 1042  BP: (!) 169/84  Pulse: 84    Home BP readings: none  Last 3 Office BP readings: BP Readings from Last 3 Encounters:  03/27/20 (!) 169/84  03/13/20 (!) 186/93  02/07/20 (!) 240/120    BMET    Component Value Date/Time   NA 140 02/07/2020 1706   K 4.3 02/07/2020 1706   CL 104 02/07/2020 1706   CO2 24 02/07/2020 1706   GLUCOSE 170 (H) 02/07/2020 1706   GLUCOSE 124 (H) 05/13/2019 0637   BUN 20 02/07/2020 1706   CREATININE 1.72 (H) 02/07/2020 1706   CALCIUM 9.4 02/07/2020 1706   GFRNONAA 42 (L) 02/07/2020 1706   GFRAA 49 (L) 02/07/2020 1706    Renal function: CrCl cannot be calculated (Patient's most recent lab result is older than the maximum 21 days allowed.).  Clinical ASCVD: No  The ASCVD Risk score Denman George DC Jr., et al., 2013) failed to calculate for the following reasons:    Cannot find a previous HDL lab   Cannot find a previous total cholesterol lab  A/P: Hypertension longstanding currently above goal on current medications. BP Goal = < 130/80 mmHg. Medication adherence reported.   -Increased dose of lisinopril to 20 mg daily.  -Continue amlodipine 10 mg daily.  -F/u labs ordered - BMP -Counseled on lifestyle modifications for blood pressure control including reduced dietary sodium, increased exercise, adequate sleep.  Results reviewed and written information provided.   Total time in face-to-face counseling 15 minutes.   F/U Clinic Visit in 1 month.   Butch Penny, PharmD, [place], CPP Clinical Pharmacist [facility] [phone]

## 2020-03-28 LAB — BASIC METABOLIC PANEL
BUN/Creatinine Ratio: 12 (ref 10–24)
BUN: 20 mg/dL (ref 8–27)
CO2: 19 mmol/L — ABNORMAL LOW (ref 20–29)
Calcium: 9.4 mg/dL (ref 8.6–10.2)
Chloride: 107 mmol/L — ABNORMAL HIGH (ref 96–106)
Creatinine, Ser: 1.71 mg/dL — ABNORMAL HIGH (ref 0.76–1.27)
Glucose: 81 mg/dL (ref 65–99)
Potassium: 4.2 mmol/L (ref 3.5–5.2)
Sodium: 142 mmol/L (ref 134–144)
eGFR: 45 mL/min/{1.73_m2} — ABNORMAL LOW (ref >59)

## 2020-03-30 ENCOUNTER — Telehealth: Payer: Self-pay

## 2020-03-30 NOTE — Telephone Encounter (Signed)
Patient was called and a voicemail was left informing patient to return phone call for lab results. 

## 2020-03-30 NOTE — Telephone Encounter (Signed)
-----   Message from Hoy Register, MD sent at 03/29/2020  8:46 AM EST ----- Labs are stable.  Continue current management

## 2020-04-13 ENCOUNTER — Other Ambulatory Visit: Payer: Self-pay | Admitting: Family Medicine

## 2020-04-13 ENCOUNTER — Encounter: Payer: Self-pay | Admitting: Family Medicine

## 2020-04-13 ENCOUNTER — Ambulatory Visit: Payer: Self-pay | Attending: Family Medicine | Admitting: Family Medicine

## 2020-04-13 ENCOUNTER — Other Ambulatory Visit: Payer: Self-pay

## 2020-04-13 VITALS — BP 163/79 | HR 77 | Ht 68.0 in | Wt 165.0 lb

## 2020-04-13 DIAGNOSIS — Z79899 Other long term (current) drug therapy: Secondary | ICD-10-CM | POA: Insufficient documentation

## 2020-04-13 DIAGNOSIS — Z7182 Exercise counseling: Secondary | ICD-10-CM | POA: Insufficient documentation

## 2020-04-13 DIAGNOSIS — I10 Essential (primary) hypertension: Secondary | ICD-10-CM

## 2020-04-13 DIAGNOSIS — Z713 Dietary counseling and surveillance: Secondary | ICD-10-CM | POA: Insufficient documentation

## 2020-04-13 DIAGNOSIS — Z7984 Long term (current) use of oral hypoglycemic drugs: Secondary | ICD-10-CM | POA: Insufficient documentation

## 2020-04-13 DIAGNOSIS — E1122 Type 2 diabetes mellitus with diabetic chronic kidney disease: Secondary | ICD-10-CM | POA: Insufficient documentation

## 2020-04-13 DIAGNOSIS — N183 Chronic kidney disease, stage 3 unspecified: Secondary | ICD-10-CM | POA: Insufficient documentation

## 2020-04-13 DIAGNOSIS — I129 Hypertensive chronic kidney disease with stage 1 through stage 4 chronic kidney disease, or unspecified chronic kidney disease: Secondary | ICD-10-CM | POA: Insufficient documentation

## 2020-04-13 MED ORDER — HYDRALAZINE HCL 50 MG PO TABS: 50.0000 mg | ORAL_TABLET | Freq: Three times a day (TID) | ORAL | 3 refills | Status: DC

## 2020-04-13 NOTE — Progress Notes (Signed)
Subjective:  Patient ID: Brent Thompson, male    DOB: [birth year]  Age: 60 y.o. MRN: [id number]  CC: Hypertension   HPI Brent Thompson is a 60 year old male with a history of type 2 diabetes mellitus (A1c 7.2), hypertension, stage III chronic kidney disease who presents today for follow-up of his hypertension. He had a visit with the clinical pharmacist last month and lisinopril was increased from 10 mg to 20 mg and he is also on amlodipine.  I have inquired about his medications which he does not have with him today but he informs me he is taking all medications prescribed.  He tells me he takes a total of 4 medications even though I have 5 on his medication list. He has been advised to bring in his medications for subsequent visits.  Past Medical History:  Diagnosis Date  . Diabetes mellitus without complication ([place])   . Hypertension     History reviewed. No pertinent surgical history.  History reviewed. No pertinent family history.  No Known Allergies  Outpatient Medications Prior to Visit  Medication Sig Dispense Refill  . amLODipine (NORVASC) 10 MG tablet TAKE 1 TABLET(10 MG) BY MOUTH DAILY 30 tablet 6  . atorvastatin (LIPITOR) 20 MG tablet Take 1 tablet (20 mg total) by mouth daily. 30 tablet 6  . blood glucose meter kit and supplies Dispense based on patient and insurance preference. Use up to four times daily as directed. (FOR ICD-10 E10.9, E11.9). 1 each 0  . Blood Pressure KIT 1 Units by Does not apply route daily. Please check blood pressure every morning and keep a log. 1 kit 0  . glipiZIDE (GLUCOTROL) 5 MG tablet TAKE 1 TABLET(5 MG) BY MOUTH TWICE DAILY BEFORE A MEAL 60 tablet 6  . lisinopril (ZESTRIL) 20 MG tablet Take 1 tablet (20 mg total) by mouth daily. 90 tablet 3  . metFORMIN (GLUCOPHAGE) 500 MG tablet Take 1 tablet (500 mg total) by mouth daily with breakfast. 30 tablet 0   No facility-administered medications prior to visit.     ROS Review of Systems   Constitutional: Negative for activity change and appetite change.  HENT: Negative for sinus pressure and sore throat.   Eyes: Negative for visual disturbance.  Respiratory: Negative for cough, chest tightness and shortness of breath.   Cardiovascular: Negative for chest pain and leg swelling.  Gastrointestinal: Negative for abdominal distention, abdominal pain, constipation and diarrhea.  Endocrine: Negative.   Genitourinary: Negative for dysuria.  Musculoskeletal: Negative for joint swelling and myalgias.  Skin: Negative for rash.  Allergic/Immunologic: Negative.   Neurological: Negative for weakness, light-headedness and numbness.  Psychiatric/Behavioral: Negative for dysphoric mood and suicidal ideas.    Objective:  BP (!) 163/79   Pulse 77   Ht 5' 8" (1.727 m)   Wt 165 lb (74.8 kg)   SpO2 100%   BMI 25.09 kg/m   BP/Weight 04/13/2020 03/27/2020 9/62/8366  Systolic BP 294 765 465  Diastolic BP 79 84 93  Wt. (Lbs) 165 - 169  BMI 25.09 - 25.7      Physical Exam Constitutional:      Appearance: He is well-developed.  Neck:     Vascular: No JVD.  Cardiovascular:     Rate and Rhythm: Normal rate.     Heart sounds: Normal heart sounds. No murmur heard.   Pulmonary:     Effort: Pulmonary effort is normal.     Breath sounds: Normal breath sounds. No wheezing or rales.  Chest:  Chest wall: No tenderness.  Abdominal:     General: Bowel sounds are normal. There is no distension.     Palpations: Abdomen is soft. There is no mass.     Tenderness: There is no abdominal tenderness.  Musculoskeletal:        General: Normal range of motion.     Right lower leg: No edema.     Left lower leg: No edema.  Neurological:     Mental Status: He is alert and oriented to person, place, and time.  Psychiatric:        Mood and Affect: Mood normal.     CMP Latest Ref Rng & Units 03/27/2020 02/07/2020 06/01/2019  Glucose 65 - 99 mg/dL 81 170(H) 127(H)  BUN 8 - 27 mg/dL _0 Creatinine 0.76 - 1.27 mg/dL 1.71(H) 1.72(H) 1.79(H)  Sodium 134 - 144 mmol/L 142 140 143  Potassium 3.5 - 5.2 mmol/L 4.2 4.3 4.4  Chloride 96 - 106 mmol/L 107(H) 104 106  CO2 20 - 29 mmol/L 19(L) 24 25  Calcium 8.6 - 10.2 mg/dL 9.4 9.4 8.8  Total Protein 6.0 - 8.5 g/dL - 6.8 6.7  Total Bilirubin 0.0 - 1.2 mg/dL - <0.2 0.3  Alkaline Phos 44 - 121 IU/L - 47 70  AST 0 - 40 IU/L - 23 32  ALT 0 - 44 IU/L - 42 23    Lipid Panel  No results found for: CHOL, TRIG, HDL, CHOLHDL, VLDL, LDLCALC, LDLDIRECT  CBC    Component Value Date/Time   WBC 5.4 05/13/2019 0637   RBC 2.99 (L) 05/13/2019 0637   HGB 9.6 (L) 05/13/2019 0637   HCT 29.0 (L) 05/13/2019 0637   PLT 241 05/13/2019 0637   MCV 97.0 05/13/2019 0637   MCH 32.1 05/13/2019 0637   MCHC 33.1 05/13/2019 0637   RDW 14.9 05/13/2019 0637   LYMPHSABS 0.8 05/13/2019 0637   MONOABS 0.4 05/13/2019 0637   EOSABS 0.0 05/13/2019 0637   BASOSABS 0.0 05/13/2019 0175    Lab Results  Component Value Date   HGBA1C 7.2 (A) 02/07/2020    Assessment & Plan:  1. Essential hypertension Uncontrolled Hydralazine added to regimen Continue amlodipine and lisinopril Advised to bring in all his medications to next visit. Counseled on blood pressure goal of less than 130/80, low-sodium, DASH diet, medication compliance, 150 minutes of moderate intensity exercise per week. Discussed medication compliance, adverse effects. - hydrALAZINE (APRESOLINE) 50 MG tablet; Take 1 tablet (50 mg total) by mouth 3 (three) times daily.  Dispense: 60 tablet; Refill: 3 - Basic Metabolic Panel   Meds ordered this encounter  Medications  . hydrALAZINE (APRESOLINE) 50 MG tablet    Sig: Take 1 tablet (50 mg total) by mouth 3 (three) times daily.    Dispense:  60 tablet    Refill:  3    Follow-up: Return in about 1 month (around 05/14/2020) for Chronic disease management.       Charlott Rakes, MD, FAAFP. [facility] and [place], [place]   04/13/2020, 9:27 AM

## 2020-04-13 NOTE — Progress Notes (Signed)
Here today for Blood pressure. Had breakfast and medications this morning.

## 2020-04-14 ENCOUNTER — Other Ambulatory Visit: Payer: Self-pay | Admitting: Family Medicine

## 2020-04-14 ENCOUNTER — Telehealth: Payer: Self-pay

## 2020-04-14 DIAGNOSIS — N1832 Chronic kidney disease, stage 3b: Secondary | ICD-10-CM

## 2020-04-14 LAB — BASIC METABOLIC PANEL
BUN/Creatinine Ratio: 11 (ref 10–24)
BUN: 18 mg/dL (ref 8–27)
CO2: 20 mmol/L (ref 20–29)
Calcium: 9.4 mg/dL (ref 8.6–10.2)
Chloride: 105 mmol/L (ref 96–106)
Creatinine, Ser: 1.67 mg/dL — ABNORMAL HIGH (ref 0.76–1.27)
Glucose: 135 mg/dL — ABNORMAL HIGH (ref 65–99)
Potassium: 4.5 mmol/L (ref 3.5–5.2)
Sodium: 140 mmol/L (ref 134–144)
eGFR: 46 mL/min/{1.73_m2} — ABNORMAL LOW (ref >59)

## 2020-04-14 NOTE — Telephone Encounter (Signed)
Patient name and DOB has been verified Patient was informed of lab results. Patient had no questions.  

## 2020-04-14 NOTE — Telephone Encounter (Signed)
-----   Message from Hoy Register, MD sent at 04/14/2020  9:28 AM EDT ----- Kidney function is still abnormal and could be due to Hypertension and Diabetes. I have referred him to Nephrology.

## 2020-04-24 ENCOUNTER — Ambulatory Visit: Payer: Medicaid Other | Admitting: Pharmacist

## 2020-05-22 ENCOUNTER — Encounter: Payer: Self-pay | Admitting: Family Medicine

## 2020-05-22 ENCOUNTER — Other Ambulatory Visit: Payer: Self-pay

## 2020-05-22 ENCOUNTER — Ambulatory Visit: Payer: Self-pay | Attending: Family Medicine | Admitting: Family Medicine

## 2020-05-22 VITALS — BP 155/85 | HR 88 | Resp 18 | Ht 68.0 in | Wt 160.0 lb

## 2020-05-22 DIAGNOSIS — Z1211 Encounter for screening for malignant neoplasm of colon: Secondary | ICD-10-CM

## 2020-05-22 DIAGNOSIS — Z720 Tobacco use: Secondary | ICD-10-CM

## 2020-05-22 DIAGNOSIS — E1169 Type 2 diabetes mellitus with other specified complication: Secondary | ICD-10-CM

## 2020-05-22 DIAGNOSIS — I10 Essential (primary) hypertension: Secondary | ICD-10-CM

## 2020-05-22 LAB — GLUCOSE, POCT (MANUAL RESULT ENTRY): POC Glucose: 146 mg/dl — AB (ref 70–99)

## 2020-05-22 LAB — POCT GLYCOSYLATED HEMOGLOBIN (HGB A1C): HbA1c, POC (controlled diabetic range): 6.4 % (ref 0.0–7.0)

## 2020-05-22 MED FILL — Glipizide Tab 5 MG: ORAL | 30 days supply | Qty: 60 | Fill #0 | Status: AC

## 2020-05-22 MED FILL — Hydralazine HCl Tab 50 MG: ORAL | 30 days supply | Qty: 90 | Fill #0 | Status: AC

## 2020-05-22 MED FILL — Amlodipine Besylate Tab 10 MG (Base Equivalent): ORAL | 30 days supply | Qty: 30 | Fill #0 | Status: AC

## 2020-05-22 MED FILL — Atorvastatin Calcium Tab 20 MG (Base Equivalent): ORAL | 30 days supply | Qty: 30 | Fill #0 | Status: AC

## 2020-05-22 NOTE — Patient Instructions (Signed)

## 2020-05-22 NOTE — Progress Notes (Signed)
Subjective:  Patient ID: Brent Thompson, male    DOB: [birth year]  Age: 60 y.o. MRN: [id number]  CC: 1 month follow up    HPI Brent Thompson  is a 60 year old male with a history of type 2 diabetes mellitus (A1c 6.4), hypertension, stage III chronic kidney disease who presents today for follow-up of his hypertension.  His BP at home has been in the 557-322 range systolic but it is elevated today and he endorses running out of his antihypertensive. His A1c is 6.4 down from 7.2 previously and he has been compliant with glipizide.  Denies episodes of hypoglycemia. He is due for colorectal cancer screening. Smokes 1 pack/week. Denies presence of chest pain, dyspnea or additional concerns. Past Medical History:  Diagnosis Date  . Diabetes mellitus without complication ([place])   . Hypertension     History reviewed. No pertinent surgical history.  History reviewed. No pertinent family history.  No Known Allergies  Outpatient Medications Prior to Visit  Medication Sig Dispense Refill  . amLODipine (NORVASC) 10 MG tablet TAKE 1 TABLET(10 MG) BY MOUTH DAILY 30 tablet 6  . amLODipine (NORVASC) 10 MG tablet TAKE 1 TABLET(10 MG) BY MOUTH DAILY 30 tablet 6  . atorvastatin (LIPITOR) 20 MG tablet Take 1 tablet (20 mg total) by mouth daily. 30 tablet 6  . atorvastatin (LIPITOR) 20 MG tablet TAKE 1 TABLET (20 MG TOTAL) BY MOUTH DAILY. 30 tablet 6  . blood glucose meter kit and supplies Dispense based on patient and insurance preference. Use up to four times daily as directed. (FOR ICD-10 E10.9, E11.9). 1 each 0  . Blood Pressure KIT 1 Units by Does not apply route daily. Please check blood pressure every morning and keep a log. 1 kit 0  . glipiZIDE (GLUCOTROL) 5 MG tablet TAKE 1 TABLET(5 MG) BY MOUTH TWICE DAILY BEFORE A MEAL 60 tablet 6  . glipiZIDE (GLUCOTROL) 5 MG tablet TAKE 1 TABLET(5 MG) BY MOUTH TWICE DAILY BEFORE A MEAL 60 tablet 6  . hydrALAZINE (APRESOLINE) 50 MG tablet Take 1 tablet (50 mg  total) by mouth 3 (three) times daily. 60 tablet 3  . hydrALAZINE (APRESOLINE) 50 MG tablet TAKE 1 TABLET (50 MG TOTAL) BY MOUTH 3 (THREE) TIMES DAILY. 60 tablet 3  . lisinopril (ZESTRIL) 20 MG tablet Take 1 tablet (20 mg total) by mouth daily. 90 tablet 3  . lisinopril (ZESTRIL) 20 MG tablet TAKE 1 TABLET (20 MG TOTAL) BY MOUTH DAILY. 90 tablet 3  . metFORMIN (GLUCOPHAGE) 500 MG tablet Take 1 tablet (500 mg total) by mouth daily with breakfast. 30 tablet 0   No facility-administered medications prior to visit.     ROS Review of Systems  Constitutional: Negative for activity change and appetite change.  HENT: Negative for sinus pressure and sore throat.   Eyes: Negative for visual disturbance.  Respiratory: Negative for cough, chest tightness and shortness of breath.   Cardiovascular: Negative for chest pain and leg swelling.  Gastrointestinal: Negative for abdominal distention, abdominal pain, constipation and diarrhea.  Endocrine: Negative.   Genitourinary: Negative for dysuria.  Musculoskeletal: Negative for joint swelling and myalgias.  Skin: Negative for rash.  Allergic/Immunologic: Negative.   Neurological: Negative for weakness, light-headedness and numbness.  Psychiatric/Behavioral: Negative for dysphoric mood and suicidal ideas.    Objective:  BP (!) 155/85   Pulse 88   Resp 18   Ht 5' 8" (1.727 m)   Wt 160 lb (72.6 kg)   SpO2 97%   BMI  24.33 kg/m   BP/Weight 05/22/2020 04/13/2020 0/26/3785  Systolic BP 885 027 741  Diastolic BP 85 79 84  Wt. (Lbs) 160 165 -  BMI 24.33 25.09 -      Physical Exam Constitutional:      Appearance: He is well-developed.  Neck:     Vascular: No JVD.  Cardiovascular:     Rate and Rhythm: Normal rate.     Heart sounds: Normal heart sounds. No murmur heard.   Pulmonary:     Effort: Pulmonary effort is normal.     Breath sounds: Normal breath sounds. No wheezing or rales.  Chest:     Chest wall: No tenderness.  Abdominal:      General: Bowel sounds are normal. There is no distension.     Palpations: Abdomen is soft. There is no mass.     Tenderness: There is no abdominal tenderness.  Musculoskeletal:        General: Normal range of motion.     Right lower leg: No edema.     Left lower leg: No edema.  Neurological:     Mental Status: He is alert and oriented to person, place, and time.  Psychiatric:        Mood and Affect: Mood normal.    Diabetic Foot Exam - Simple   Simple Foot Form Diabetic Foot exam was performed with the following findings: Yes 05/22/2020  9:35 AM  Visual Inspection See comments: Yes Sensation Testing Intact to touch and monofilament testing bilaterally: Yes Pulse Check Posterior Tibialis and Dorsalis pulse intact bilaterally: Yes Comments Loss of L big toenail. No ulcerations or skin break down     CMP Latest Ref Rng & Units 04/13/2020 03/27/2020 02/07/2020  Glucose 65 - 99 mg/dL 135(H) 81 170(H)  BUN 8 - 27 mg/dL _0 Creatinine 0.76 - 1.27 mg/dL 1.67(H) 1.71(H) 1.72(H)  Sodium 134 - 144 mmol/L 140 142 140  Potassium 3.5 - 5.2 mmol/L 4.5 4.2 4.3  Chloride 96 - 106 mmol/L 105 107(H) 104  CO2 20 - 29 mmol/L 20 19(L) 24  Calcium 8.6 - 10.2 mg/dL 9.4 9.4 9.4  Total Protein 6.0 - 8.5 g/dL - - 6.8  Total Bilirubin 0.0 - 1.2 mg/dL - - <0.2  Alkaline Phos 44 - 121 IU/L - - 47  AST 0 - 40 IU/L - - 23  ALT 0 - 44 IU/L - - 42    Lipid Panel  No results found for: CHOL, TRIG, HDL, CHOLHDL, VLDL, LDLCALC, LDLDIRECT  CBC    Component Value Date/Time   WBC 5.4 05/13/2019 0637   RBC 2.99 (L) 05/13/2019 0637   HGB 9.6 (L) 05/13/2019 0637   HCT 29.0 (L) 05/13/2019 0637   PLT 241 05/13/2019 0637   MCV 97.0 05/13/2019 0637   MCH 32.1 05/13/2019 0637   MCHC 33.1 05/13/2019 0637   RDW 14.9 05/13/2019 0637   LYMPHSABS 0.8 05/13/2019 0637   MONOABS 0.4 05/13/2019 0637   EOSABS 0.0 05/13/2019 0637   BASOSABS 0.0 05/13/2019 0637    Lab Results  Component Value Date   HGBA1C  6.4 05/22/2020    Assessment & Plan:  1. Type 2 diabetes mellitus with other specified complication, without long-term current use of insulin (HCC) Controlled with A1c of 6.4 which is down from 7.2 He denies episodes of hypoglycemia Counseled on management of hypoglycemia and the need to decrease glipizide from 5 mg to 2.5 mg if this occurs Counseled on Diabetic diet, my plate  method, 150 minutes of moderate intensity exercise/week Blood sugar logs with fasting goals of 80-120 mg/dl, random of less than 180 and in the event of sugars less than 60 mg/dl or greater than 400 mg/dl encouraged to notify the clinic. Advised on the need for annual eye exams, annual foot exams, Pneumonia vaccine. - POCT glucose (manual entry) - POCT glycosylated hemoglobin (Hb A1C)  2. Essential hypertension Uncontrolled This is due to his fact that he ran out of his antihypertensives Advised to pick up prescription from the pharmacy  3. Screening for colon cancer - Fecal occult blood, imunochemical([facility])  4. Tobacco abuse Smoking cessation support: smoking cessation hotline: 1-800-QUIT-NOW.  Smoking cessation classes are available through [facility]. Call [phone] or visit our website at [email or web address]  Spent 3 minutes counseling on dangers of tobacco use and benefits of quitting, offered pharmacological intervention to aid quitting and patient is working on quitting.    Health Care Maintenance: Will address additional care gaps at next office visit. No orders of the defined types were placed in this encounter.   Follow-up: Return in about 3 months (around 08/21/2020) for chronic medical conditions.       Charlott Rakes, MD, FAAFP. [facility] and [place], [place]   05/22/2020, 9:59 AM

## 2021-03-18 IMAGING — DX DG CHEST 1V PORT
1 series · 1 of 1 positions shown · non-contrast
Comparison: May 06, 2019.

CLINICAL DATA: Fever.

EXAM:
PORTABLE CHEST 1 VIEW

[chest ap]
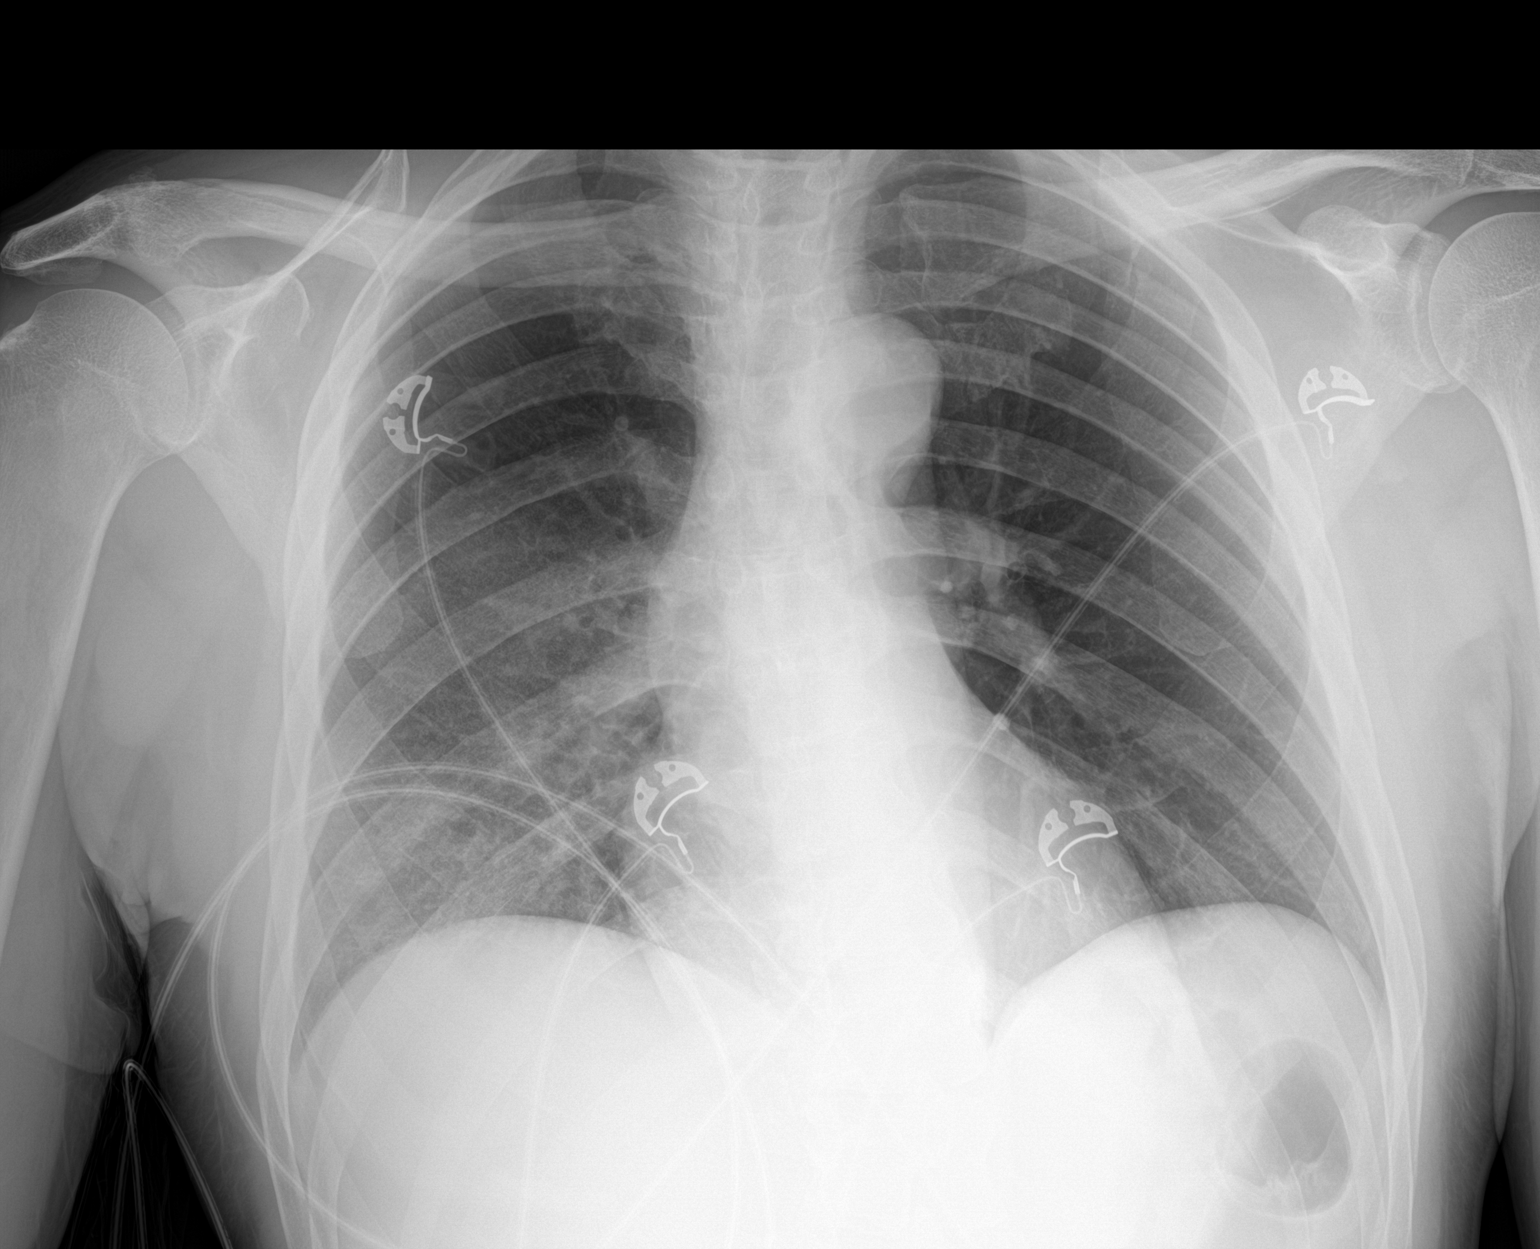

[1 of 1 positions shown; findings below may reference images not displayed]

FINDINGS: The heart size and mediastinal contours are within normal limits. No
pneumothorax or pleural effusion is noted. Left lung is clear. Mild
right basilar opacity is noted concerning for pneumonia. The
visualized skeletal structures are unremarkable.
IMPRESSION: Mild right basilar opacity is noted concerning for pneumonia.
Followup radiographs are recommended until resolution.

## 2021-03-18 IMAGING — CT CT ABD-PELV W/O CM
2 of 4 series · 15 of 46 positions shown, 17 images · non-contrast
Comparison: 03/07/2017

CLINICAL DATA: Abdominal pain, constipation, nausea and fevers.

EXAM:
CT ABDOMEN AND PELVIS WITHOUT CONTRAST
TECHNIQUE: Multidetector CT imaging of the abdomen and pelvis was performed
following the standard protocol without IV contrast.

[Series 2: axial st · axial · 0.73mm/px · z∈[-525,-85]mm · 12 of 102 slices shown, 14 images]
[im 9/102  soft-tissue]
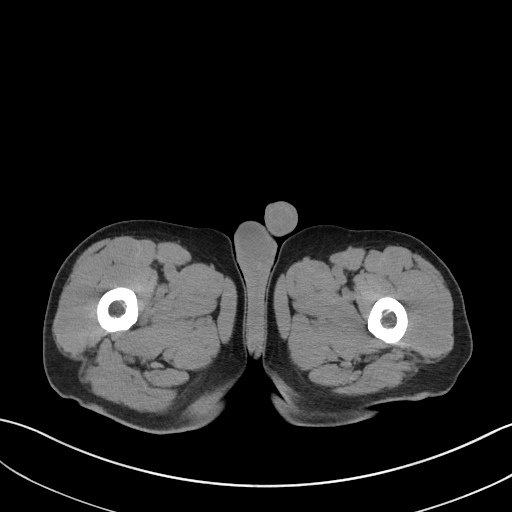
[im 9/102  bone]
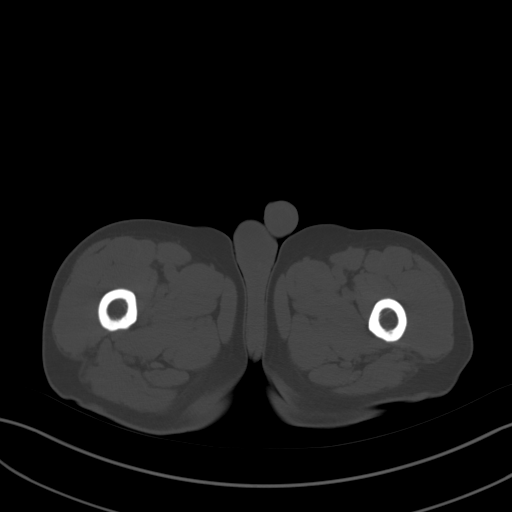
[im 17/102  soft-tissue]
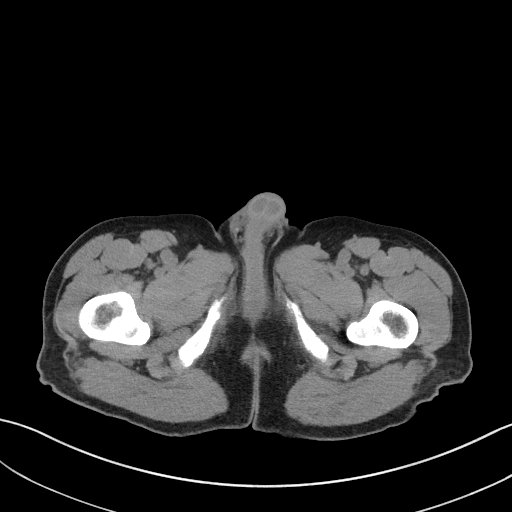
[im 25/102  soft-tissue]
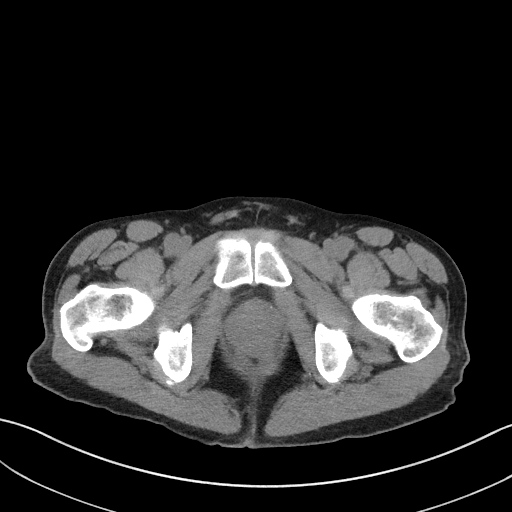
[im 33/102  soft-tissue]
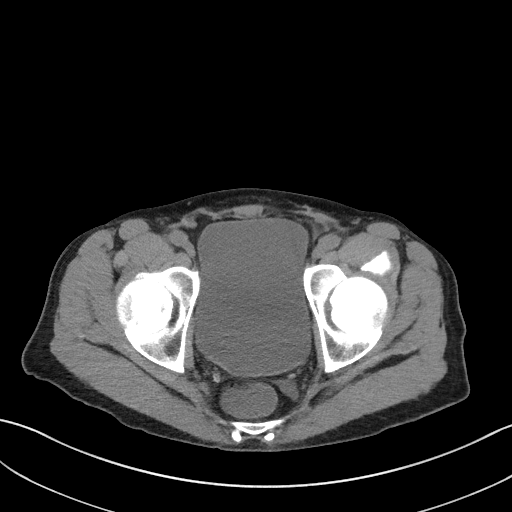
[im 41/102  soft-tissue]
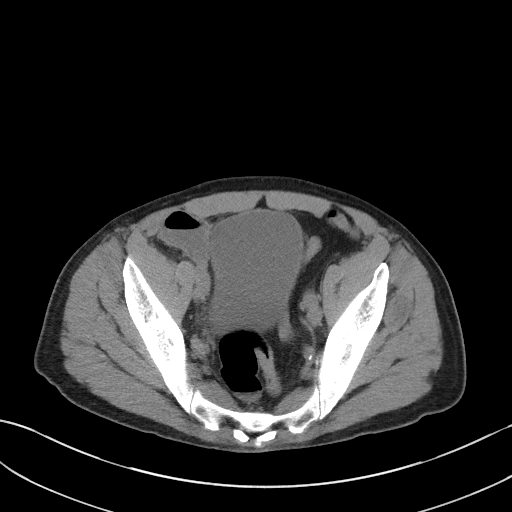
[im 49/102  soft-tissue]
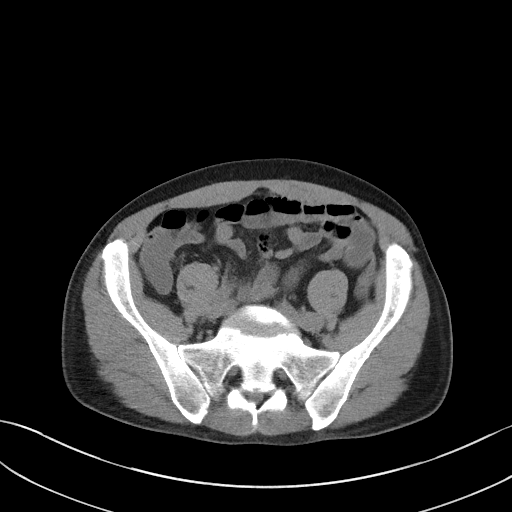
[im 57/102  soft-tissue]
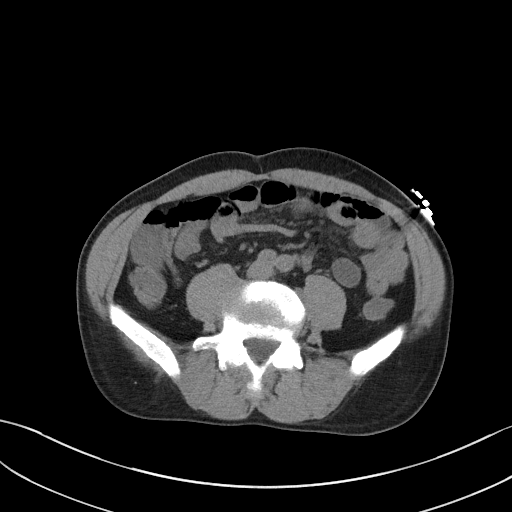
[im 65/102  soft-tissue]
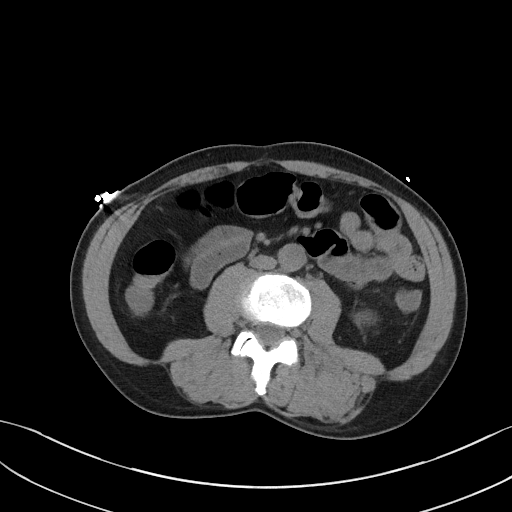
[im 73/102  soft-tissue]
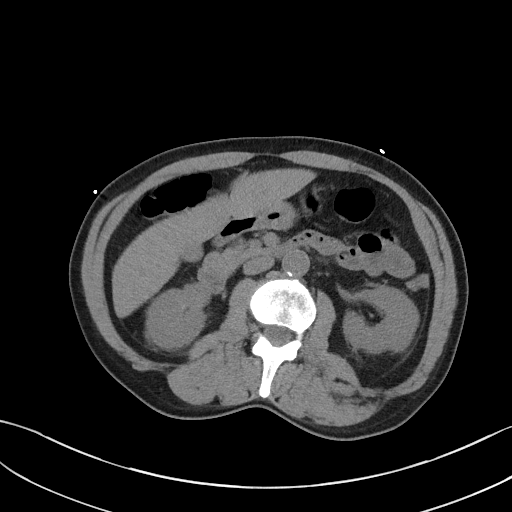
[im 73/102  bone]
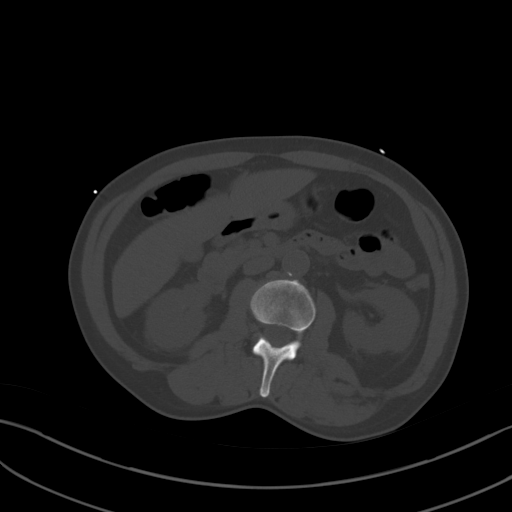
[im 81/102  soft-tissue]
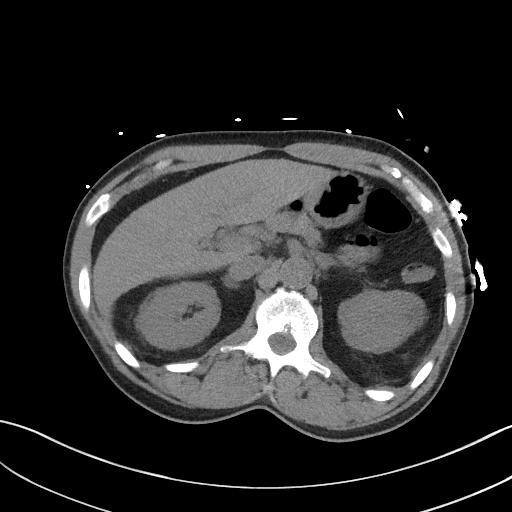
[im 89/102  soft-tissue]
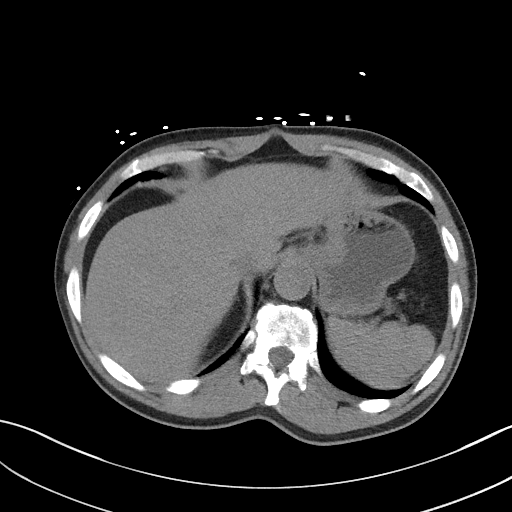
[im 97/102  soft-tissue]
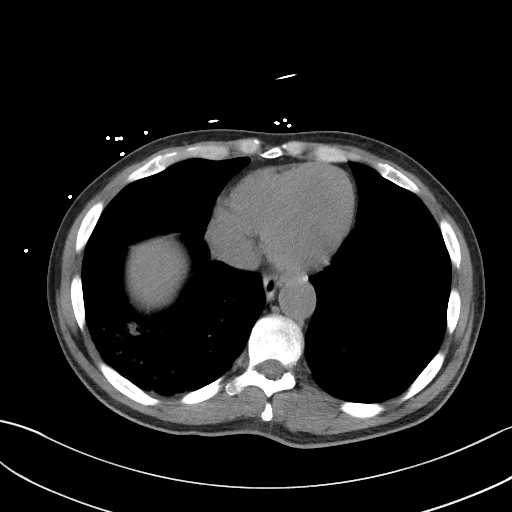

[Series 5: coronal st · coronal · 0.73mm/px · 3 of 83 slices shown]
[im 28/83  soft-tissue]
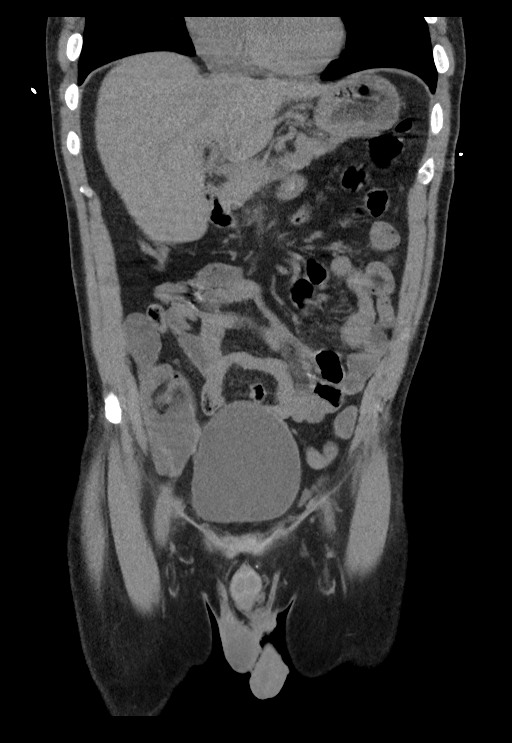
[im 37/83  soft-tissue]
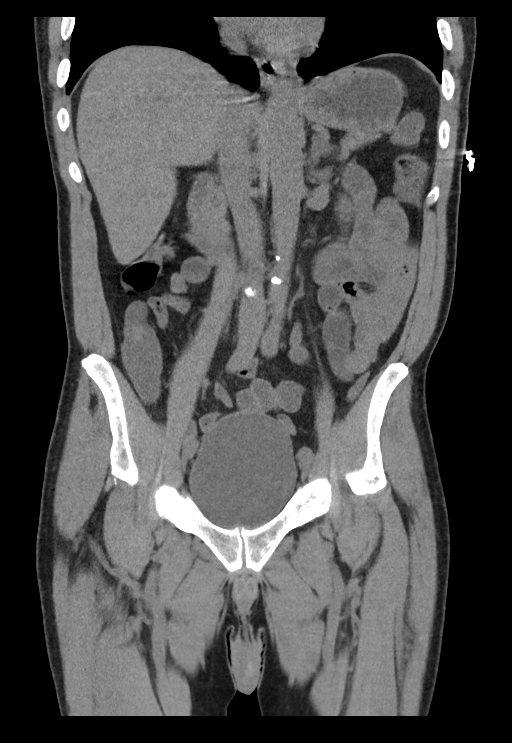
[im 46/83  soft-tissue]
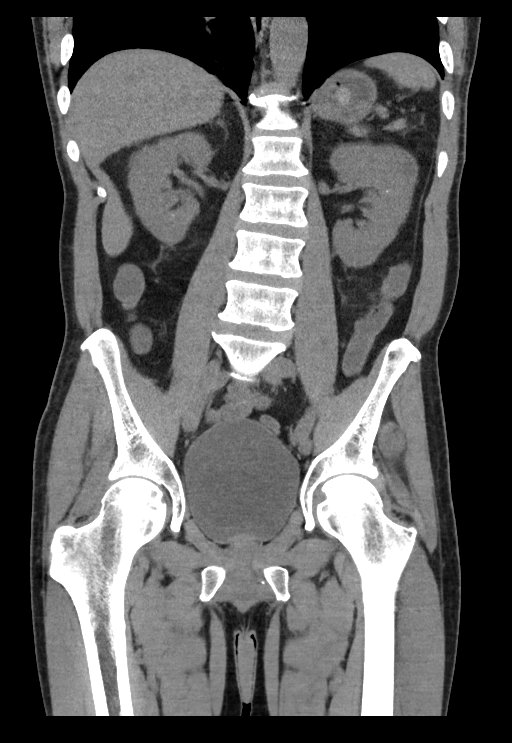

[15 of 46 positions shown; findings below may reference images not displayed]

FINDINGS: Lower chest: Ground-glass attenuation and peribronchovascular
consolidation noted involving much of the visualized portions of the
right lower lobe concerning for pneumonia.

Hepatobiliary: No focal liver abnormality is seen. No gallstones,
gallbladder wall thickening, or biliary dilatation.

Pancreas: Unremarkable. No pancreatic ductal dilatation or
surrounding inflammatory changes.

Spleen: Normal in size without focal abnormality.

Adrenals/Urinary Tract: Normal appearance of the adrenal glands.
Tiny, punctate stones identified in both kidneys, nonspecific. There
is bilateral perinephric soft tissue stranding, new from previous
exam. No hydronephrosis. Left kidney cyst measures 1.7 cm and is too
small to characterize, image [DATE]. The urinary bladder appears
unremarkable.

Stomach/Bowel: Stomach is within normal limits. The appendix is not
confidently identified separate from the right lower quadrant bowel
loops. No evidence of bowel wall thickening, distention, or
inflammatory changes.

Vascular/Lymphatic: Mild aortic atherosclerosis. There is no
aneurysm. No abdominopelvic adenopathy identified. No pelvic or
inguinal adenopathy.

Reproductive: Prostate is unremarkable.

Other: No free fluid or fluid collections. Small fat containing
umbilical hernia.

Musculoskeletal: Scoliosis and degenerative disc disease.
IMPRESSION: 1. Ground-glass attenuation and peribronchovascular densities noted
involving much of the visualized portions of the right lower lobe
concerning for pneumonia.
2. Bilateral perinephric soft tissue stranding is new from previous
exam nonspecific. Correlate for any clinical signs or symptoms of
pyelonephritis.
3. Tiny, punctate stones are identified in both kidneys no
hydronephrosis, hydroureter or ureteral calculi.
4. Aortic atherosclerosis.

Aortic Atherosclerosis ([id number]).

## 2021-06-30 ENCOUNTER — Emergency Department (HOSPITAL_BASED_OUTPATIENT_CLINIC_OR_DEPARTMENT_OTHER): Payer: Self-pay

## 2021-06-30 ENCOUNTER — Encounter (HOSPITAL_BASED_OUTPATIENT_CLINIC_OR_DEPARTMENT_OTHER): Payer: Self-pay | Admitting: Emergency Medicine

## 2021-06-30 ENCOUNTER — Other Ambulatory Visit: Payer: Self-pay

## 2021-06-30 ENCOUNTER — Emergency Department (HOSPITAL_BASED_OUTPATIENT_CLINIC_OR_DEPARTMENT_OTHER)
Admission: EM | Admit: 2021-06-30 | Discharge: 2021-06-30 | Disposition: A | Discharge status: Home / Self Care | Payer: Self-pay | Attending: Emergency Medicine | Admitting: Emergency Medicine

## 2021-06-30 ENCOUNTER — Emergency Department (HOSPITAL_BASED_OUTPATIENT_CLINIC_OR_DEPARTMENT_OTHER): Payer: Medicaid Other

## 2021-06-30 DIAGNOSIS — Z91148 Patient's other noncompliance with medication regimen for other reason: Secondary | ICD-10-CM

## 2021-06-30 DIAGNOSIS — Z79899 Other long term (current) drug therapy: Secondary | ICD-10-CM | POA: Insufficient documentation

## 2021-06-30 DIAGNOSIS — M546 Pain in thoracic spine: Secondary | ICD-10-CM | POA: Insufficient documentation

## 2021-06-30 DIAGNOSIS — I129 Hypertensive chronic kidney disease with stage 1 through stage 4 chronic kidney disease, or unspecified chronic kidney disease: Secondary | ICD-10-CM | POA: Diagnosis not present

## 2021-06-30 DIAGNOSIS — Z91199 Patient's noncompliance with other medical treatment and regimen due to unspecified reason: Secondary | ICD-10-CM | POA: Insufficient documentation

## 2021-06-30 DIAGNOSIS — E119 Type 2 diabetes mellitus without complications: Secondary | ICD-10-CM | POA: Insufficient documentation

## 2021-06-30 DIAGNOSIS — I10 Essential (primary) hypertension: Secondary | ICD-10-CM

## 2021-06-30 DIAGNOSIS — Z7984 Long term (current) use of oral hypoglycemic drugs: Secondary | ICD-10-CM | POA: Insufficient documentation

## 2021-06-30 DIAGNOSIS — R531 Weakness: Secondary | ICD-10-CM | POA: Insufficient documentation

## 2021-06-30 DIAGNOSIS — M542 Cervicalgia: Secondary | ICD-10-CM | POA: Diagnosis not present

## 2021-06-30 DIAGNOSIS — N1832 Chronic kidney disease, stage 3b: Secondary | ICD-10-CM | POA: Insufficient documentation

## 2021-06-30 DIAGNOSIS — Y92410 Unspecified street and highway as the place of occurrence of the external cause: Secondary | ICD-10-CM | POA: Diagnosis not present

## 2021-06-30 MED ORDER — LISINOPRIL 10 MG PO TABS
20.0000 mg | ORAL_TABLET | Freq: Once | ORAL | Status: DC
Start: 2021-06-30 — End: 2021-06-30
  Filled 2021-06-30: qty 2

## 2021-06-30 MED ORDER — LIDOCAINE 5 % EX PTCH
1.0000 | MEDICATED_PATCH | CUTANEOUS | Status: DC
  Administered 2021-06-30: 1 via TRANSDERMAL
  Filled 2021-06-30: qty 1

## 2021-06-30 MED ORDER — LISINOPRIL 10 MG PO TABS
20.0000 mg | ORAL_TABLET | Freq: Once | ORAL | Status: AC
  Administered 2021-06-30: 20 mg via ORAL
  Filled 2021-06-30: qty 2

## 2021-06-30 MED ORDER — AMLODIPINE BESYLATE 10 MG PO TABS: 10.0000 mg | ORAL_TABLET | Freq: Every day | ORAL | 0 refills | Status: DC

## 2021-06-30 MED ORDER — ONDANSETRON HCL 4 MG/2ML IJ SOLN
4.0000 mg | Freq: Once | INTRAMUSCULAR | Status: DC
Start: 2021-06-30 — End: 2021-06-30

## 2021-06-30 MED ORDER — OXYCODONE-ACETAMINOPHEN 5-325 MG PO TABS
1.0000 | ORAL_TABLET | ORAL | Status: DC | PRN
  Filled 2021-06-30: qty 1

## 2021-06-30 MED ORDER — HYDRALAZINE HCL 25 MG PO TABS
50.0000 mg | ORAL_TABLET | Freq: Once | ORAL | Status: DC
  Filled 2021-06-30: qty 2

## 2021-06-30 MED ORDER — LISINOPRIL 20 MG PO TABS: 20.0000 mg | ORAL_TABLET | Freq: Every day | ORAL | 0 refills | Status: DC

## 2021-06-30 MED ORDER — AMLODIPINE BESYLATE 5 MG PO TABS
10.0000 mg | ORAL_TABLET | Freq: Once | ORAL | Status: DC
  Filled 2021-06-30: qty 2

## 2021-06-30 MED ORDER — HYDRALAZINE HCL 50 MG PO TABS: 50.0000 mg | ORAL_TABLET | Freq: Three times a day (TID) | ORAL | 0 refills | Status: DC

## 2021-06-30 MED ORDER — HYDRALAZINE HCL 25 MG PO TABS
50.0000 mg | ORAL_TABLET | Freq: Once | ORAL | Status: AC
Start: 2021-06-30 — End: 2021-06-30
  Administered 2021-06-30: 50 mg via ORAL
  Filled 2021-06-30: qty 2

## 2021-06-30 MED ORDER — ONDANSETRON 4 MG PO TBDP
8.0000 mg | ORAL_TABLET | Freq: Once | ORAL | Status: AC
  Administered 2021-06-30: 8 mg via ORAL
  Filled 2021-06-30: qty 2

## 2021-06-30 MED ORDER — AMLODIPINE BESYLATE 5 MG PO TABS
10.0000 mg | ORAL_TABLET | Freq: Once | ORAL | Status: AC
  Administered 2021-06-30: 10 mg via ORAL
  Filled 2021-06-30: qty 2

## 2021-06-30 NOTE — ED Notes (Signed)
Client noted to be retching, states he feels some better, pt tx to exam room 3. VS updated, placed on cont cardiac monitoring

## 2021-06-30 NOTE — ED Notes (Signed)
Patients blood pressure is elevated patient denies any chest pain,sob ,blurred vision or any other s/s of hypertension

## 2021-06-30 NOTE — ED Notes (Signed)
Patient blood pressure was elevated Dr. Denton Lank is aware.sent  patient Blood pressure medication to pharmacy. Patient is aware. Rn went over the importance of getting the medication and taking it daily. Also advised patient to follow up with his primary care. Patient denies any s/s of hypertension.
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
## 2021-06-30 NOTE — ED Notes (Signed)
LIDODERM PATCH 5% APPLIED TO BOTH THE LOWER BACK AREA AT WAIST BAND AND TO POSTERIOR AREA OF LEFT SHOULDER WHERE CLIENT STATES PAIN IS THE WORSE.

## 2021-06-30 NOTE — Discharge Instructions (Addendum)
Your blood pressure is high. Take your blood pressure medicine as prescribed, limit salt intake, eat heart health meal plan, and follow up closely with primary care doctor in the coming week  Your work-up today does not show any signs of severe injury, I suspect your pain is likely musculoskeletal.  Take Tylenol Motrin.  Follow-up with your primary next week for reevaluation.  Return for lower extremity weakness, loss of bladder or bowel function, chest pain, shortness of breath, abdominal pain.

## 2021-06-30 NOTE — ED Notes (Signed)
C Collar remains on at this time, GCS 15. MAE x 4, was able to move/ transfer from chair to wc for radiological studies
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
## 2021-08-23 DIAGNOSIS — M546 Pain in thoracic spine: Secondary | ICD-10-CM | POA: Insufficient documentation

## 2021-08-23 DIAGNOSIS — M545 Low back pain, unspecified: Secondary | ICD-10-CM | POA: Insufficient documentation

## 2021-08-23 DIAGNOSIS — M47812 Spondylosis without myelopathy or radiculopathy, cervical region: Secondary | ICD-10-CM | POA: Insufficient documentation

## 2021-10-16 ENCOUNTER — Ambulatory Visit: Payer: Medicaid Other | Admitting: Family Medicine

## 2021-10-20 ENCOUNTER — Other Ambulatory Visit: Payer: Self-pay | Admitting: Family Medicine

## 2021-10-20 ENCOUNTER — Encounter: Payer: Self-pay | Admitting: Family

## 2021-10-20 ENCOUNTER — Ambulatory Visit: Payer: No Typology Code available for payment source | Attending: Family Medicine | Admitting: Family

## 2021-10-20 VITALS — BP 214/114 | HR 90 | Temp 98.3°F | Resp 16 | Ht 67.99 in | Wt 148.0 lb

## 2021-10-20 DIAGNOSIS — I160 Hypertensive urgency: Secondary | ICD-10-CM

## 2021-10-20 DIAGNOSIS — E1122 Type 2 diabetes mellitus with diabetic chronic kidney disease: Secondary | ICD-10-CM

## 2021-10-20 DIAGNOSIS — E1169 Type 2 diabetes mellitus with other specified complication: Secondary | ICD-10-CM

## 2021-10-20 DIAGNOSIS — I10 Essential (primary) hypertension: Secondary | ICD-10-CM

## 2021-10-20 DIAGNOSIS — E119 Type 2 diabetes mellitus without complications: Secondary | ICD-10-CM

## 2021-10-20 DIAGNOSIS — N183 Chronic kidney disease, stage 3 unspecified: Secondary | ICD-10-CM

## 2021-10-20 DIAGNOSIS — Z23 Encounter for immunization: Secondary | ICD-10-CM

## 2021-10-20 LAB — POCT GLYCOSYLATED HEMOGLOBIN (HGB A1C): Hemoglobin A1C: 11.1 % — AB (ref 4.0–5.6)

## 2021-10-20 LAB — GLUCOSE, POCT (MANUAL RESULT ENTRY): POC Glucose: 260 mg/dl — AB (ref 70–99)

## 2021-10-20 MED ORDER — LANTUS SOLOSTAR 100 UNIT/ML ~~LOC~~ SOPN: 10.0000 [IU] | PEN_INJECTOR | Freq: Every day | SUBCUTANEOUS | 99 refills | Status: DC

## 2021-10-20 MED ORDER — LISINOPRIL 20 MG PO TABS: 20.0000 mg | ORAL_TABLET | Freq: Every day | ORAL | 3 refills | Status: DC

## 2021-10-20 MED ORDER — ATORVASTATIN CALCIUM 20 MG PO TABS: 20.0000 mg | ORAL_TABLET | Freq: Every day | ORAL | 6 refills | Status: DC

## 2021-10-20 MED ORDER — BLOOD GLUCOSE MONITOR KIT: PACK | 0 refills | Status: AC

## 2021-10-20 MED ORDER — AMLODIPINE BESYLATE 10 MG PO TABS: 10.0000 mg | ORAL_TABLET | Freq: Every day | ORAL | 0 refills | Status: DC

## 2021-10-20 MED ORDER — HYDRALAZINE HCL 10 MG PO TABS
25.0000 mg | ORAL_TABLET | Freq: Once | ORAL | Status: AC
  Administered 2021-10-20: 25 mg via ORAL

## 2021-10-20 MED ORDER — EMPAGLIFLOZIN 10 MG PO TABS: 10.0000 mg | ORAL_TABLET | Freq: Every day | ORAL | 2 refills | Status: DC

## 2021-10-20 MED ORDER — GLIPIZIDE 5 MG PO TABS: ORAL_TABLET | ORAL | 6 refills | Status: DC

## 2021-10-20 NOTE — Patient Instructions (Addendum)
1) Labs today 2) with your high hemoglobin A1c, starting Lantus 10 units under the skin once per day would help control your high blood sugars. 3) Monitor your blood sugar 3 times per day  4) Start taking Jardiance 10 mg by mouth once per day. Stay hydrated while taking this medication 5) Follow up in 3 weeks with your primary care provider 6) We referred you to a diabetes educator, they will call you to schedule your meeting with them 7) Schedule for eye exam 8) For high Blood Pressure, please go to the nearest ED as we talked about 9) Take Tylenol as needed for fever or pain after flu vaccine today
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
## 2021-10-20 NOTE — Progress Notes (Signed)
.  Pt presents for chronic care management   -needs medication refills on all meds

## 2021-10-22 NOTE — Telephone Encounter (Signed)
dc'd 03/13/2020 changed dose Dr. Margarita Rana  Requested Prescriptions  Refused Prescriptions Disp Refills  . lisinopril (ZESTRIL) 10 MG tablet [Pharmacy Med Name: LISINOPRIL 10MG  TABLETS] 30 tablet 6    Sig: TAKE 1 TABLET(10 MG) BY MOUTH DAILY     Cardiovascular:  ACE Inhibitors Failed - 10/20/2021 11:43 AM      Failed - Cr in normal range and within 180 days    Creatinine, Ser  Date Value Ref Range Status  04/13/2020 1.67 (H) 0.76 - 1.27 mg/dL Final         Failed - K in normal range and within 180 days    Potassium  Date Value Ref Range Status  04/13/2020 4.5 3.5 - 5.2 mmol/L Final         Failed - Last BP in normal range    BP Readings from Last 1 Encounters:  10/20/21 (!) 214/114         Passed - Patient is not pregnant      Passed - Valid encounter within last 6 months    Recent Outpatient Visits          2 days ago Essential hypertension   [place], Broadus John, FNP   1 year ago Type 2 diabetes mellitus with other specified complication, without long-term current use of insulin ([place])   [place], Charlane Ferretti, MD   1 year ago Essential hypertension   [place], Charlane Ferretti, MD   1 year ago Accelerated hypertension   [place], Jarome Matin, RPH-CPP   1 year ago Accelerated hypertension   [place], Enobong, MD      Future Appointments            In 3 weeks Charlott Rakes, MD [facility]

## 2021-10-23 LAB — THYROID PANEL WITH TSH
Free Thyroxine Index: 2 (ref 1.2–4.9)
T3 Uptake Ratio: 29 % (ref 24–39)
T4, Total: 6.9 ug/dL (ref 4.5–12.0)
TSH: 0.632 u[IU]/mL (ref 0.450–4.500)

## 2021-10-23 LAB — CBC WITH DIFFERENTIAL/PLATELET
Basophils Absolute: 0 10*3/uL (ref 0.0–0.2)
Basos: 1 %
EOS (ABSOLUTE): 0.1 10*3/uL (ref 0.0–0.4)
Eos: 1 %
Hematocrit: 40.1 % (ref 37.5–51.0)
Hemoglobin: 13 g/dL (ref 13.0–17.7)
Immature Grans (Abs): 0 10*3/uL (ref 0.0–0.1)
Immature Granulocytes: 1 %
Lymphocytes Absolute: 2.1 10*3/uL (ref 0.7–3.1)
Lymphs: 45 %
MCH: 33 pg (ref 26.6–33.0)
MCHC: 32.4 g/dL (ref 31.5–35.7)
MCV: 102 fL — ABNORMAL HIGH (ref 79–97)
Monocytes Absolute: 0.4 10*3/uL (ref 0.1–0.9)
Monocytes: 9 %
Neutrophils Absolute: 2 10*3/uL (ref 1.4–7.0)
Neutrophils: 43 %
Platelets: 255 10*3/uL (ref 150–450)
RBC: 3.94 x10E6/uL — ABNORMAL LOW (ref 4.14–5.80)
RDW: 13.2 % (ref 11.6–15.4)
WBC: 4.6 10*3/uL (ref 3.4–10.8)

## 2021-10-23 LAB — CMP14+EGFR
ALT: 39 IU/L (ref 0–44)
AST: 37 IU/L (ref 0–40)
Albumin/Globulin Ratio: 1.9 (ref 1.2–2.2)
Albumin: 4.7 g/dL (ref 3.9–4.9)
Alkaline Phosphatase: 60 IU/L (ref 44–121)
BUN/Creatinine Ratio: 10 (ref 10–24)
BUN: 17 mg/dL (ref 8–27)
Bilirubin Total: 0.8 mg/dL (ref 0.0–1.2)
CO2: 21 mmol/L (ref 20–29)
Calcium: 10 mg/dL (ref 8.6–10.2)
Chloride: 99 mmol/L (ref 96–106)
Creatinine, Ser: 1.68 mg/dL — ABNORMAL HIGH (ref 0.76–1.27)
Globulin, Total: 2.5 g/dL (ref 1.5–4.5)
Glucose: 250 mg/dL — ABNORMAL HIGH (ref 70–99)
Potassium: 5.4 mmol/L — ABNORMAL HIGH (ref 3.5–5.2)
Sodium: 137 mmol/L (ref 134–144)
Total Protein: 7.2 g/dL (ref 6.0–8.5)
eGFR: 45 mL/min/{1.73_m2} — ABNORMAL LOW (ref >59)

## 2021-10-23 LAB — LIPID PANEL
Chol/HDL Ratio: 3.2 ratio (ref 0.0–5.0)
Cholesterol, Total: 183 mg/dL (ref 100–199)
HDL: 58 mg/dL (ref >39)
LDL Chol Calc (NIH): 73 mg/dL (ref 0–99)
Triglycerides: 326 mg/dL — ABNORMAL HIGH (ref 0–149)
VLDL Cholesterol Cal: 52 mg/dL — ABNORMAL HIGH (ref 5–40)

## 2021-10-23 LAB — VITAMIN D 25 HYDROXY (VIT D DEFICIENCY, FRACTURES): Vit D, 25-Hydroxy: 24.5 ng/mL — ABNORMAL LOW (ref 30.0–100.0)

## 2021-10-25 ENCOUNTER — Other Ambulatory Visit: Payer: Self-pay | Admitting: Family

## 2021-10-25 ENCOUNTER — Telehealth: Payer: Self-pay | Admitting: Family

## 2021-10-25 NOTE — Telephone Encounter (Signed)
Patient called this morning for lab results. Instructed to start taking his vitamin d supplements and follow up as scheduled with his PCP. Patient reports that he is taking his BP medications as ordered. His most recent home BP reading was 138/98, per patient. Denies CP or SOB. Reminded that his kidney function had decreased and should stay hydrated with well managed DM2 and HTN. Reminded to report new symptom to the clinic or local ED. Verbalized understanding. All his questions were answered.

## 2021-11-12 ENCOUNTER — Ambulatory Visit: Payer: Self-pay | Attending: Family Medicine | Admitting: Family Medicine

## 2021-11-12 ENCOUNTER — Encounter: Payer: Self-pay | Admitting: Family Medicine

## 2021-11-12 ENCOUNTER — Other Ambulatory Visit: Payer: Self-pay

## 2021-11-12 VITALS — BP 158/87 | HR 79 | Ht 68.0 in | Wt 160.0 lb

## 2021-11-12 DIAGNOSIS — Z1211 Encounter for screening for malignant neoplasm of colon: Secondary | ICD-10-CM

## 2021-11-12 DIAGNOSIS — E1169 Type 2 diabetes mellitus with other specified complication: Secondary | ICD-10-CM

## 2021-11-12 DIAGNOSIS — I10 Essential (primary) hypertension: Secondary | ICD-10-CM

## 2021-11-12 LAB — GLUCOSE, POCT (MANUAL RESULT ENTRY): POC Glucose: 142 mg/dl — AB (ref 70–99)

## 2021-11-12 MED ORDER — ATORVASTATIN CALCIUM 20 MG PO TABS
20.0000 mg | ORAL_TABLET | Freq: Every day | ORAL | 3 refills | Status: DC
  Filled 2021-11-12 – 2021-11-21 (×2): qty 30, 30d supply, fill #0
  Filled 2022-01-15: qty 30, 30d supply, fill #1
  Filled 2022-03-20: qty 30, 30d supply, fill #2
  Filled 2022-05-09: qty 30, 30d supply, fill #3

## 2021-11-12 MED ORDER — EMPAGLIFLOZIN 10 MG PO TABS
10.0000 mg | ORAL_TABLET | Freq: Every day | ORAL | 3 refills | Status: DC
  Filled 2021-11-12 – 2021-11-21 (×3): qty 30, 30d supply, fill #0
  Filled 2022-01-15: qty 30, 30d supply, fill #1
  Filled 2022-03-20: qty 30, 30d supply, fill #2
  Filled 2022-05-09: qty 30, 30d supply, fill #3

## 2021-11-12 MED ORDER — TRUE METRIX BLOOD GLUCOSE TEST VI STRP
1.0000 | ORAL_STRIP | Freq: Three times a day (TID) | 12 refills | Status: AC
  Filled 2021-11-12: qty 100, 34d supply, fill #0
  Filled 2021-11-21: qty 100, 33d supply, fill #0

## 2021-11-12 MED ORDER — AMLODIPINE BESYLATE 10 MG PO TABS
10.0000 mg | ORAL_TABLET | Freq: Every day | ORAL | 3 refills | Status: DC
  Filled 2021-11-12 – 2021-11-21 (×2): qty 30, 30d supply, fill #0
  Filled 2022-01-15: qty 30, 30d supply, fill #1
  Filled 2022-03-20: qty 30, 30d supply, fill #2
  Filled 2022-05-09: qty 30, 30d supply, fill #3

## 2021-11-12 MED ORDER — HYDRALAZINE HCL 100 MG PO TABS
100.0000 mg | ORAL_TABLET | Freq: Three times a day (TID) | ORAL | 3 refills | Status: DC
  Filled 2021-11-12 – 2021-11-21 (×2): qty 90, 30d supply, fill #0
  Filled 2022-01-15: qty 90, 30d supply, fill #1
  Filled 2022-03-20: qty 90, 30d supply, fill #2
  Filled 2022-05-09: qty 90, 30d supply, fill #3

## 2021-11-12 MED ORDER — GLIPIZIDE 5 MG PO TABS
ORAL_TABLET | ORAL | 6 refills | Status: DC
  Filled 2021-11-12 – 2021-11-21 (×2): qty 60, 30d supply, fill #0
  Filled 2022-01-15: qty 60, 30d supply, fill #1
  Filled 2022-03-20: qty 60, 30d supply, fill #2

## 2021-11-12 MED ORDER — TRUEPLUS LANCETS 28G MISC
1.0000 | Freq: Three times a day (TID) | 12 refills | Status: AC
  Filled 2021-11-12 – 2021-11-21 (×2): qty 100, 33d supply, fill #0

## 2021-11-12 MED ORDER — TRUE METRIX METER W/DEVICE KIT
1.0000 | PACK | Freq: Three times a day (TID) | 0 refills | Status: AC
  Filled 2021-11-12 – 2021-11-21 (×2): qty 1, 30d supply, fill #0

## 2021-11-12 MED ORDER — LISINOPRIL 20 MG PO TABS
20.0000 mg | ORAL_TABLET | Freq: Every day | ORAL | 3 refills | Status: DC
  Filled 2021-11-12 – 2021-11-21 (×2): qty 30, 30d supply, fill #0
  Filled 2022-01-15: qty 30, 30d supply, fill #1
  Filled 2022-03-20: qty 30, 30d supply, fill #2
  Filled 2022-05-09: qty 30, 30d supply, fill #3

## 2021-11-12 MED ORDER — METFORMIN HCL 500 MG PO TABS
500.0000 mg | ORAL_TABLET | Freq: Every day | ORAL | 3 refills | Status: DC
  Filled 2021-11-12 – 2021-11-21 (×3): qty 30, 30d supply, fill #0
  Filled 2022-01-15: qty 30, 30d supply, fill #1
  Filled 2022-03-20: qty 30, 30d supply, fill #2

## 2021-11-12 NOTE — Progress Notes (Signed)
Subjective:  Patient ID: Brent Thompson, male    DOB: 06-May-1960  Age: 61 y.o. MRN: [id number]  CC: Hypertension and Diabetes   HPI Alexiz Sustaita is a 61 y.o. year old male with a history of type 2 diabetes mellitus (A1c 11.1), hypertension, stage III chronic kidney disease who presents today for follow-up of his hypertension.  Interval History: A1c was 11.1 last month up from 6.4 previously. At his last visit last he had been without medications for a while He has not been checking his blood sugars. He has no insurance and it seems his medications and testing supplies were sent to [facility] previously and he has not been able to afford his supplies. Lantus appears on his med list but he has not been taking it as he says he would rather not take an injectable but currently remains on glipizide, Jardiance and metformin.  Endorses adherence with his antihypertensive. Denies additional concerns.  Past Medical History:  Diagnosis Date   Diabetes mellitus without complication ([place])    Hypertension     No past surgical history on file.  No family history on file.  Social History   Socioeconomic History   Marital status: Single    Spouse name: Not on file   Number of children: Not on file   Years of education: Not on file   Highest education level: Not on file  Occupational History   Not on file  Tobacco Use   Smoking status: Every Day    Packs/day: 1.00    Types: Cigarettes    Passive exposure: Current   Smokeless tobacco: Never  Substance and Sexual Activity   Alcohol use: Yes   Drug use: Yes    Types: Other-see comments   Sexual activity: Not on file  Other Topics Concern   Not on file  Social History Narrative   Not on file   Social Determinants of Health   Financial Resource Strain: Not on file  Food Insecurity: Not on file  Transportation Needs: Not on file  Physical Activity: Not on file  Stress: Not on file  Social Connections: Not on file    No Known  Allergies  Outpatient Medications Prior to Visit  Medication Sig Dispense Refill   blood glucose meter kit and supplies KIT Dispense based on patient and insurance preference. Use up to four times daily as directed. 1 each 0   blood glucose meter kit and supplies Dispense based on patient and insurance preference. Use up to four times daily as directed. (FOR ICD-10 E10.9, E11.9). 1 each 0   Blood Pressure KIT 1 Units by Does not apply route daily. Please check blood pressure every morning and keep a log. 1 kit 0   insulin glargine (LANTUS SOLOSTAR) 100 UNIT/ML Solostar Pen Inject 10 Units into the skin daily. 15 mL PRN   amLODipine (NORVASC) 10 MG tablet Take 1 tablet (10 mg total) by mouth daily. 90 tablet 0   atorvastatin (LIPITOR) 20 MG tablet Take 1 tablet (20 mg total) by mouth daily. 30 tablet 6   empagliflozin (JARDIANCE) 10 MG TABS tablet Take 1 tablet (10 mg total) by mouth daily before breakfast. 30 tablet 2   glipiZIDE (GLUCOTROL) 5 MG tablet TAKE 1 TABLET(5 MG) BY MOUTH TWICE DAILY BEFORE A MEAL 60 tablet 6   hydrALAZINE (APRESOLINE) 50 MG tablet Take 1 tablet (50 mg total) by mouth 3 (three) times daily. 60 tablet 3   lisinopril (ZESTRIL) 20 MG tablet Take 1 tablet (20  mg total) by mouth daily. 90 tablet 3   metFORMIN (GLUCOPHAGE) 500 MG tablet Take 1 tablet (500 mg total) by mouth daily with breakfast. 30 tablet 0   No facility-administered medications prior to visit.     ROS Review of Systems  Constitutional:  Negative for activity change and appetite change.  HENT:  Negative for sinus pressure and sore throat.   Respiratory:  Negative for chest tightness, shortness of breath and wheezing.   Cardiovascular:  Negative for chest pain and palpitations.  Gastrointestinal:  Negative for abdominal distention, abdominal pain and constipation.  Genitourinary: Negative.   Musculoskeletal: Negative.   Psychiatric/Behavioral:  Negative for behavioral problems and dysphoric mood.      Objective:  BP (!) 158/87   Pulse 79   Ht '5\' 8"'  (1.727 m)   Wt 160 lb (72.6 kg)   SpO2 100%   BMI 24.33 kg/m      11/12/2021    9:37 AM 10/20/2021   10:16 AM 10/20/2021    9:24 AM  BP/Weight  Systolic BP 007 121 975  Diastolic BP 87 883 254  Wt. (Lbs) 160  148  BMI 24.33 kg/m2  22.51 kg/m2      Physical Exam Constitutional:      Appearance: He is well-developed.  Cardiovascular:     Rate and Rhythm: Normal rate.     Heart sounds: Normal heart sounds. No murmur heard. Pulmonary:     Effort: Pulmonary effort is normal.     Breath sounds: Normal breath sounds. No wheezing or rales.  Chest:     Chest wall: No tenderness.  Abdominal:     General: Bowel sounds are normal. There is no distension.     Palpations: Abdomen is soft. There is no mass.     Tenderness: There is no abdominal tenderness.  Musculoskeletal:        General: Normal range of motion.     Right lower leg: No edema.     Left lower leg: No edema.  Neurological:     Mental Status: He is alert and oriented to person, place, and time.  Psychiatric:        Mood and Affect: Mood normal.        Latest Ref Rng & Units 10/20/2021   11:04 AM 04/13/2020    9:31 AM 03/27/2020   10:43 AM  CMP  Glucose 70 - 99 mg/dL 250  135  81   BUN 8 - 27 mg/dL '17  18  20   ' Creatinine 0.76 - 1.27 mg/dL 1.68  1.67  1.71   Sodium 134 - 144 mmol/L 137  140  142   Potassium 3.5 - 5.2 mmol/L 5.4  4.5  4.2   Chloride 96 - 106 mmol/L 99  105  107   CO2 20 - 29 mmol/L '21  20  19   ' Calcium 8.6 - 10.2 mg/dL 10.0  9.4  9.4   Total Protein 6.0 - 8.5 g/dL 7.2     Total Bilirubin 0.0 - 1.2 mg/dL 0.8     Alkaline Phos 44 - 121 IU/L 60     AST 0 - 40 IU/L 37     ALT 0 - 44 IU/L 39       Lipid Panel     Component Value Date/Time   CHOL 183 10/20/2021 1104   TRIG 326 (H) 10/20/2021 1104   HDL 58 10/20/2021 1104   CHOLHDL 3.2 10/20/2021 1104   LDLCALC 73 10/20/2021 1104  CBC    Component Value Date/Time   WBC 4.6  10/20/2021 1104   WBC 5.4 05/13/2019 0637   RBC 3.94 (L) 10/20/2021 1104   RBC 2.99 (L) 05/13/2019 0637   HGB 13.0 10/20/2021 1104   HCT 40.1 10/20/2021 1104   PLT 255 10/20/2021 1104   MCV 102 (H) 10/20/2021 1104   MCH 33.0 10/20/2021 1104   MCH 32.1 05/13/2019 0637   MCHC 32.4 10/20/2021 1104   MCHC 33.1 05/13/2019 0637   RDW 13.2 10/20/2021 1104   LYMPHSABS 2.1 10/20/2021 1104   MONOABS 0.4 05/13/2019 0637   EOSABS 0.1 10/20/2021 1104   BASOSABS 0.0 10/20/2021 1104    Lab Results  Component Value Date   HGBA1C 11.1 (A) 10/20/2021    Assessment & Plan:  1. Type 2 diabetes mellitus with other specified complication, without long-term current use of insulin (HCC) Uncontrolled with A1c of 11.1; goal is less than 7.0 He had been without medications at the time A1c was 11.1.  Blood glucose in the clinic today is normal at 142 hence I will make no regimen changes today He will continue to hold off on Lantus and we will reevaluate his management at his next visit when his A1c is due in 2 months Counseled on Diabetic diet, my plate method, 237 minutes of moderate intensity exercise/week Blood sugar logs with fasting goals of 80-120 mg/dl, random of less than 180 and in the event of sugars less than 60 mg/dl or greater than 400 mg/dl encouraged to notify the clinic. Advised on the need for annual eye exams, annual foot exams, Pneumonia vaccine. - POCT glucose (manual entry) - glucose blood (TRUE METRIX BLOOD GLUCOSE TEST) test strip; Use as directed 3 times daily  Dispense: 100 each; Refill: 12 - Blood Glucose Monitoring Suppl (TRUE METRIX METER) w/Device KIT; Use as directed 3 (three) times daily before meals.  Dispense: 1 kit; Refill: 0 - TRUEplus Lancets 28G MISC; Use as directed 3 (three) times daily before meals.  Dispense: 100 each; Refill: 12 - glipiZIDE (GLUCOTROL) 5 MG tablet; TAKE 1 TABLET(5 MG) BY MOUTH TWICE DAILY BEFORE A MEAL  Dispense: 60 tablet; Refill: 6 - CMP14+EGFR -  Microalbumin/Creatinine Ratio, Urine - atorvastatin (LIPITOR) 20 MG tablet; Take 1 tablet (20 mg total) by mouth daily.  Dispense: 30 tablet; Refill: 3 - empagliflozin (JARDIANCE) 10 MG TABS tablet; Take 1 tablet (10 mg total) by mouth daily before breakfast.  Dispense: 30 tablet; Refill: 3  2. Essential hypertension Slightly above goal Increase hydralazine dose.  Would like to add on for telederm but due to CKD will hold off Counseled on blood pressure goal of less than 130/80, low-sodium, DASH diet, medication compliance, 150 minutes of moderate intensity exercise per week. Discussed medication compliance, adverse effects. - hydrALAZINE (APRESOLINE) 100 MG tablet; Take 1 tablet (100 mg total) by mouth 3 (three) times daily.  Dispense: 90 tablet; Refill: 3 - amLODipine (NORVASC) 10 MG tablet; Take 1 tablet (10 mg total) by mouth daily.  Dispense: 30 tablet; Refill: 3 - lisinopril (ZESTRIL) 20 MG tablet; Take 1 tablet (20 mg total) by mouth daily.  Dispense: 30 tablet; Refill: 3  3. Screening for colon cancer - Fecal occult blood, imunochemical([facility])    Meds ordered this encounter  Medications   glucose blood (TRUE METRIX BLOOD GLUCOSE TEST) test strip    Sig: Use as directed 3 times daily    Dispense:  100 each    Refill:  12   Blood Glucose  Monitoring Suppl (TRUE METRIX METER) w/Device KIT    Sig: Use as directed 3 (three) times daily before meals.    Dispense:  1 kit    Refill:  0   TRUEplus Lancets 28G MISC    Sig: Use as directed 3 (three) times daily before meals.    Dispense:  100 each    Refill:  12   glipiZIDE (GLUCOTROL) 5 MG tablet    Sig: TAKE 1 TABLET(5 MG) BY MOUTH TWICE DAILY BEFORE A MEAL    Dispense:  60 tablet    Refill:  6   hydrALAZINE (APRESOLINE) 100 MG tablet    Sig: Take 1 tablet (100 mg total) by mouth 3 (three) times daily.    Dispense:  90 tablet    Refill:  3    Dose increase   atorvastatin (LIPITOR) 20 MG tablet    Sig: Take 1 tablet  (20 mg total) by mouth daily.    Dispense:  30 tablet    Refill:  3   amLODipine (NORVASC) 10 MG tablet    Sig: Take 1 tablet (10 mg total) by mouth daily.    Dispense:  30 tablet    Refill:  3   empagliflozin (JARDIANCE) 10 MG TABS tablet    Sig: Take 1 tablet (10 mg total) by mouth daily before breakfast.    Dispense:  30 tablet    Refill:  3   lisinopril (ZESTRIL) 20 MG tablet    Sig: Take 1 tablet (20 mg total) by mouth daily.    Dispense:  30 tablet    Refill:  3   metFORMIN (GLUCOPHAGE) 500 MG tablet    Sig: Take 1 tablet (500 mg total) by mouth daily with breakfast.    Dispense:  30 tablet    Refill:  3    Follow-up: Return in about 2 months (around 01/12/2022) for Diabetes follow-up.       Charlott Rakes, MD, FAAFP. [facility] [place], [place]   11/12/2021, 10:31 AM

## 2021-11-12 NOTE — Patient Instructions (Signed)

## 2021-11-13 ENCOUNTER — Telehealth: Payer: Self-pay

## 2021-11-13 LAB — CMP14+EGFR
ALT: 76 IU/L — ABNORMAL HIGH (ref 0–44)
AST: 51 IU/L — ABNORMAL HIGH (ref 0–40)
Albumin/Globulin Ratio: 1.8 (ref 1.2–2.2)
Albumin: 4.8 g/dL (ref 3.9–4.9)
Alkaline Phosphatase: 49 IU/L (ref 44–121)
BUN/Creatinine Ratio: 8 — ABNORMAL LOW (ref 10–24)
BUN: 13 mg/dL (ref 8–27)
Bilirubin Total: 0.5 mg/dL (ref 0.0–1.2)
CO2: 21 mmol/L (ref 20–29)
Calcium: 9.8 mg/dL (ref 8.6–10.2)
Chloride: 102 mmol/L (ref 96–106)
Creatinine, Ser: 1.62 mg/dL — ABNORMAL HIGH (ref 0.76–1.27)
Globulin, Total: 2.7 g/dL (ref 1.5–4.5)
Glucose: 128 mg/dL — ABNORMAL HIGH (ref 70–99)
Potassium: 4.4 mmol/L (ref 3.5–5.2)
Sodium: 139 mmol/L (ref 134–144)
Total Protein: 7.5 g/dL (ref 6.0–8.5)
eGFR: 47 mL/min/{1.73_m2} — ABNORMAL LOW (ref >59)

## 2021-11-13 LAB — MICROALBUMIN / CREATININE URINE RATIO
Creatinine, Urine: 30.1 mg/dL
Microalb/Creat Ratio: 124 mg/g creat — ABNORMAL HIGH (ref 0–29)
Microalbumin, Urine: 37.4 ug/mL

## 2021-11-13 NOTE — Telephone Encounter (Signed)
Pt was called and is aware of results, DOB was confirmed.  ?

## 2021-11-13 NOTE — Telephone Encounter (Signed)
-----   Message from Charlott Rakes, MD sent at 11/13/2021  5:09 PM EDT ----- Please inform him that his labs reveal presence of protein in the urine which indicates diabetes is starting to affect his kidneys.  Adequate glycemic control is important to prevent worsening.  Please advise him to avoid medications like Aleve and ibuprofen which can affect kidney functions.  Liver enzymes are minimally elevated but we will continue to monitor this.

## 2021-11-16 ENCOUNTER — Other Ambulatory Visit: Payer: Self-pay

## 2021-11-19 ENCOUNTER — Other Ambulatory Visit: Payer: Self-pay

## 2021-11-21 ENCOUNTER — Other Ambulatory Visit: Payer: Self-pay

## 2022-01-14 ENCOUNTER — Ambulatory Visit: Payer: Medicaid Other | Admitting: Family Medicine

## 2022-01-15 ENCOUNTER — Other Ambulatory Visit: Payer: Self-pay

## 2022-02-04 ENCOUNTER — Ambulatory Visit: Payer: Medicaid Other | Attending: Physician Assistant | Admitting: Physician Assistant

## 2022-02-04 ENCOUNTER — Encounter: Payer: Self-pay | Admitting: Physician Assistant

## 2022-02-04 VITALS — BP 166/101 | HR 72 | Ht 68.0 in | Wt 168.6 lb

## 2022-02-04 DIAGNOSIS — N1832 Chronic kidney disease, stage 3b: Secondary | ICD-10-CM | POA: Diagnosis not present

## 2022-02-04 DIAGNOSIS — E1165 Type 2 diabetes mellitus with hyperglycemia: Secondary | ICD-10-CM | POA: Diagnosis not present

## 2022-02-04 DIAGNOSIS — E559 Vitamin D deficiency, unspecified: Secondary | ICD-10-CM | POA: Diagnosis not present

## 2022-02-04 DIAGNOSIS — I10 Essential (primary) hypertension: Secondary | ICD-10-CM | POA: Diagnosis not present

## 2022-02-04 LAB — GLUCOSE, POCT (MANUAL RESULT ENTRY): POC Glucose: 101 mg/dl — AB (ref 70–99)

## 2022-02-04 LAB — POCT GLYCOSYLATED HEMOGLOBIN (HGB A1C): HbA1c, POC (controlled diabetic range): 6.7 % (ref 0.0–7.0)

## 2022-02-04 NOTE — Progress Notes (Signed)
Patient ID: Brent Thompson, male   DOB: Oct 10, 1960, 62 y.o.   MRN: [id number]   Brent Thompson, is a 62 y.o. male  [id number]:[id number]  [id number]  DOB - [birth year]  Chief Complaint  Patient presents with   Diabetes       Subjective:   Brent Thompson is a 62 y.o. male here today for recheck diabetes.  Last A1C was 11.1.  he has not been checking his blood sugars.  He is just getting over a cold and feels good overall.  He does not currently need RF.  He did not take BP meds this morning.     No problems updated.  ALLERGIES: No Known Allergies  PAST MEDICAL HISTORY: Past Medical History:  Diagnosis Date   Diabetes mellitus without complication (HCC)    Hypertension     MEDICATIONS AT HOME: Prior to Admission medications   Medication Sig Start Date End Date Taking? Authorizing Provider  amLODipine (NORVASC) 10 MG tablet Take 1 tablet (10 mg total) by mouth daily. 11/12/21  Yes Hoy Register, MD  atorvastatin (LIPITOR) 20 MG tablet Take 1 tablet (20 mg total) by mouth daily. 11/12/21  Yes Hoy Register, MD  blood glucose meter kit and supplies KIT Dispense based on patient and insurance preference. Use up to four times daily as directed. 10/20/21  Yes Eleonore Chiquito, FNP  blood glucose meter kit and supplies Dispense based on patient and insurance preference. Use up to four times daily as directed. (FOR ICD-10 E10.9, E11.9). 05/13/19  Yes Arrien, [place] Ram, MD  Blood Glucose Monitoring Suppl (TRUE METRIX METER) w/Device KIT Use as directed 3 (three) times daily before meals. 11/12/21  Yes Hoy Register, MD  Blood Pressure KIT 1 Units by Does not apply route daily. Please check blood pressure every morning and keep a log. 05/13/19  Yes Arrien, [place] Ram, MD  empagliflozin (JARDIANCE) 10 MG TABS tablet Take 1 tablet (10 mg total) by mouth daily before breakfast. 11/12/21  Yes Newlin, Enobong, MD  glipiZIDE (GLUCOTROL) 5 MG tablet TAKE 1 TABLET(5 MG) BY MOUTH TWICE DAILY  BEFORE A MEAL 11/12/21  Yes Newlin, Enobong, MD  glucose blood (TRUE METRIX BLOOD GLUCOSE TEST) test strip Use as directed 3 times daily 11/12/21  Yes Newlin, Enobong, MD  hydrALAZINE (APRESOLINE) 100 MG tablet Take 1 tablet (100 mg total) by mouth 3 (three) times daily. 11/12/21  Yes Newlin, Odette Horns, MD  insulin glargine (LANTUS SOLOSTAR) 100 UNIT/ML Solostar Pen Inject 10 Units into the skin daily. 10/20/21  Yes Eleonore Chiquito, FNP  lisinopril (ZESTRIL) 20 MG tablet Take 1 tablet (20 mg total) by mouth daily. 11/12/21  Yes Hoy Register, MD  metFORMIN (GLUCOPHAGE) 500 MG tablet Take 1 tablet (500 mg total) by mouth daily with breakfast. 11/12/21  Yes Newlin, Enobong, MD  TRUEplus Lancets 28G MISC Use as directed 3 (three) times daily before meals. 11/12/21  Yes Newlin, Enobong, MD    ROS: Neg HEENT Neg resp Neg cardiac Neg GI Neg GU Neg MS Neg psych Neg neuro  Objective:   Vitals:   02/04/22 0845  BP: (!) 159/101  Pulse: 72  SpO2: 99%  Weight: 168 lb 9.6 oz (76.5 kg)  Height: 5\' 8"  (1.727 m)   Exam General appearance : Awake, alert, not in any distress. Speech Clear. Not toxic looking HEENT: Atraumatic and Normocephalic, pupils equally reactive to light and accomodation Neck: Supple, no JVD. No cervical lymphadenopathy.  Chest: Good air entry bilaterally, CTAB.  No rales/rhonchi/wheezing CVS: S1 S2 regular,  no murmurs.  Abdomen: Bowel sounds present, Non tender and not distended with no gaurding, rigidity or rebound. Extremities: B/L Lower Ext shows no edema, both legs are warm to touch Neurology: Awake alert, and oriented X 3, CN II-XII intact, Non focal Skin: No Rash  Data Review Lab Results  Component Value Date   HGBA1C 11.1 (A) 10/20/2021   HGBA1C 6.4 05/22/2020   HGBA1C 7.2 (A) 02/07/2020    Assessment & Plan   1. Uncontrolled type 2 diabetes mellitus with hyperglycemia (HCC) A1C went from 11.1 to 6.7.  continue current regimen.  RF all meds for the next  3 months if he needs them please(currently does not need RF.   - Glucose (CBG) - HgB A1c - Comprehensive metabolic panel - CBC with Differential/Platelet - Lipid panel  2. Essential hypertension Did not take meds-take medications - CBC with Differential/Platelet  3. Stage 3b chronic kidney disease ([place]) - Comprehensive metabolic panel - CBC with Differential/Platelet  4.  Vitamin D deficiency-check vitamin D  Return in about 3 months (around 05/06/2022) for PCP for chronic conditions.  The patient was given clear instructions to go to ER or return to medical center if symptoms don't improve, worsen or new problems develop. The patient verbalized understanding. The patient was told to call to get lab results if they haven't heard anything in the next week.      Freeman Caldron, PA-C [facility] [address], [place]   02/04/2022, 8:57 AM

## 2022-02-04 NOTE — Patient Instructions (Signed)
Drink 64-80 ounces water daily.    Check your blood sugars regularly

## 2022-02-05 LAB — LIPID PANEL
Chol/HDL Ratio: 3 ratio (ref 0.0–5.0)
Cholesterol, Total: 122 mg/dL (ref 100–199)
HDL: 41 mg/dL (ref >39)
LDL Chol Calc (NIH): 54 mg/dL (ref 0–99)
Triglycerides: 160 mg/dL — ABNORMAL HIGH (ref 0–149)
VLDL Cholesterol Cal: 27 mg/dL (ref 5–40)

## 2022-02-05 LAB — COMPREHENSIVE METABOLIC PANEL
ALT: 38 IU/L (ref 0–44)
AST: 31 IU/L (ref 0–40)
Albumin/Globulin Ratio: 2 (ref 1.2–2.2)
Albumin: 4.6 g/dL (ref 3.9–4.9)
Alkaline Phosphatase: 48 IU/L (ref 44–121)
BUN/Creatinine Ratio: 9 — ABNORMAL LOW (ref 10–24)
BUN: 17 mg/dL (ref 8–27)
Bilirubin Total: 0.3 mg/dL (ref 0.0–1.2)
CO2: 21 mmol/L (ref 20–29)
Calcium: 9.6 mg/dL (ref 8.6–10.2)
Chloride: 104 mmol/L (ref 96–106)
Creatinine, Ser: 1.83 mg/dL — ABNORMAL HIGH (ref 0.76–1.27)
Globulin, Total: 2.3 g/dL (ref 1.5–4.5)
Glucose: 97 mg/dL (ref 70–99)
Potassium: 4.8 mmol/L (ref 3.5–5.2)
Sodium: 139 mmol/L (ref 134–144)
Total Protein: 6.9 g/dL (ref 6.0–8.5)
eGFR: 41 mL/min/{1.73_m2} — ABNORMAL LOW (ref >59)

## 2022-02-05 LAB — CBC WITH DIFFERENTIAL/PLATELET
Basophils Absolute: 0 10*3/uL (ref 0.0–0.2)
Basos: 1 %
EOS (ABSOLUTE): 0.1 10*3/uL (ref 0.0–0.4)
Eos: 1 %
Hematocrit: 35.3 % — ABNORMAL LOW (ref 37.5–51.0)
Hemoglobin: 11.3 g/dL — ABNORMAL LOW (ref 13.0–17.7)
Immature Grans (Abs): 0 10*3/uL (ref 0.0–0.1)
Immature Granulocytes: 0 %
Lymphocytes Absolute: 2.1 10*3/uL (ref 0.7–3.1)
Lymphs: 39 %
MCH: 31.8 pg (ref 26.6–33.0)
MCHC: 32 g/dL (ref 31.5–35.7)
MCV: 99 fL — ABNORMAL HIGH (ref 79–97)
Monocytes Absolute: 0.6 10*3/uL (ref 0.1–0.9)
Monocytes: 10 %
Neutrophils Absolute: 2.6 10*3/uL (ref 1.4–7.0)
Neutrophils: 49 %
Platelets: 229 10*3/uL (ref 150–450)
RBC: 3.55 x10E6/uL — ABNORMAL LOW (ref 4.14–5.80)
RDW: 13.7 % (ref 11.6–15.4)
WBC: 5.4 10*3/uL (ref 3.4–10.8)

## 2022-02-05 LAB — VITAMIN D 25 HYDROXY (VIT D DEFICIENCY, FRACTURES): Vit D, 25-Hydroxy: 31.4 ng/mL (ref 30.0–100.0)

## 2022-03-20 ENCOUNTER — Other Ambulatory Visit: Payer: Self-pay

## 2022-05-06 ENCOUNTER — Other Ambulatory Visit: Payer: Self-pay | Admitting: Pharmacist

## 2022-05-06 NOTE — Progress Notes (Signed)
Patient outreached by Karlton Lemon, PharmD Candidate on 05/06/2022 to discuss hypertension.   Patient has an automated home blood pressure machine. They report their last home reading was 140/78, taken after they took their BP medications.   Medication review was performed. They are taking medications as prescribed.   The following barriers to adherence were noted:  - They do not have cost concerns.  - They do not have transportation concerns.  - They do not need assistance obtaining refills.  - They do not occasionally forget to take some of their prescribed medications.  - They do not feel like one/some of their medications make them feel poorly.  - They do not have questions or concerns about their medications.  - They do have follow up scheduled with their primary care provider/cardiologist.   The following interventions were completed:  - Medications were reviewed  - Patient was educated on goal blood pressures and long term health implications of elevated blood pressure  - Patient was educated on proper technique to check home blood pressure and reminded to bring home machine and readings to next provider appointment  - Patient was educated on use of adherence strategies, like a pill box or alarms. Patient currently uses a pill box to remember to take their medications.  - Patient was counseled on lifestyle modifications to improve blood pressure, including diet and exercise. Patient dose not currently do anything for diet, but was educated on the importance of a low sodium diet. Patient does not do anything currently for exercise, but was reccommended to get at least 150 minutes of moderate exercise per week.   The patient has follow up scheduled:  05/09/2022 with Dr. Alvis Lemmings    Karlton Lemon, PharmD Candidate   Catie Eppie Gibson, PharmD, BCACP, CPP [facility] [phone]

## 2022-05-09 ENCOUNTER — Encounter: Payer: Self-pay | Admitting: Family Medicine

## 2022-05-09 ENCOUNTER — Ambulatory Visit: Payer: Medicaid Other | Attending: Family Medicine | Admitting: Family Medicine

## 2022-05-09 ENCOUNTER — Other Ambulatory Visit: Payer: Self-pay

## 2022-05-09 VITALS — BP 160/85 | HR 88 | Ht 68.0 in | Wt 171.2 lb

## 2022-05-09 DIAGNOSIS — K5909 Other constipation: Secondary | ICD-10-CM

## 2022-05-09 DIAGNOSIS — I129 Hypertensive chronic kidney disease with stage 1 through stage 4 chronic kidney disease, or unspecified chronic kidney disease: Secondary | ICD-10-CM

## 2022-05-09 DIAGNOSIS — Z1211 Encounter for screening for malignant neoplasm of colon: Secondary | ICD-10-CM | POA: Diagnosis not present

## 2022-05-09 DIAGNOSIS — E1169 Type 2 diabetes mellitus with other specified complication: Secondary | ICD-10-CM

## 2022-05-09 DIAGNOSIS — E1122 Type 2 diabetes mellitus with diabetic chronic kidney disease: Secondary | ICD-10-CM | POA: Diagnosis not present

## 2022-05-09 DIAGNOSIS — N1832 Chronic kidney disease, stage 3b: Secondary | ICD-10-CM

## 2022-05-09 LAB — POCT GLYCOSYLATED HEMOGLOBIN (HGB A1C): HbA1c, POC (controlled diabetic range): 7.6 % — AB (ref 0.0–7.0)

## 2022-05-09 MED ORDER — CHLORTHALIDONE 25 MG PO TABS
25.0000 mg | ORAL_TABLET | Freq: Every day | ORAL | 1 refills | Status: DC
  Filled 2022-05-09: qty 90, 90d supply, fill #0

## 2022-05-09 MED ORDER — METAMUCIL SMOOTH TEXTURE 58.6 % PO POWD
1.0000 | Freq: Three times a day (TID) | ORAL | 12 refills | Status: AC
  Filled 2022-05-09: qty 283, fill #0

## 2022-05-09 MED ORDER — GLIPIZIDE 10 MG PO TABS
ORAL_TABLET | ORAL | 1 refills | Status: DC
  Filled 2022-05-09: qty 180, 90d supply, fill #0

## 2022-05-09 NOTE — Progress Notes (Signed)
Constipation. Medication refills.

## 2022-05-09 NOTE — Patient Instructions (Signed)

## 2022-05-09 NOTE — Progress Notes (Signed)
Subjective:  Patient ID: Brent Thompson, male    DOB: 06-12-1960  Age: 62 y.o. MRN: [id number]  CC: Diabetes   HPI Brent Thompson is a 62 y.o. year old male with a history of type 2 diabetes mellitus (A1c 7.6), hypertension, stage III chronic kidney disease, Nicotine dependence (1 pack /week x2 years)  who presents today for follow-up of his hypertension.   Interval History:  A1c is 7.6 down from 11.1 and he denies presence of hypoglycemia, neuropathy, visual concerns. He has been adherent with his statin and his antihypertensive.  Currently does not see nephrology for his CKD.  He Complains of constipation and took Metamucil in the past with some relief. Bowel movements occur once/day but are hard. He has no hematochezia. Does not have an adequate intake of fiber Past Medical History:  Diagnosis Date   Diabetes mellitus without complication (HCC)    Hypertension     No past surgical history on file.  No family history on file.  Social History   Socioeconomic History   Marital status: Single    Spouse name: Not on file   Number of children: Not on file   Years of education: Not on file   Highest education level: Not on file  Occupational History   Not on file  Tobacco Use   Smoking status: Every Day    Packs/day: 1    Types: Cigarettes    Passive exposure: Current   Smokeless tobacco: Never  Substance and Sexual Activity   Alcohol use: Yes   Drug use: Yes    Types: Other-see comments   Sexual activity: Not on file  Other Topics Concern   Not on file  Social History Narrative   Not on file   Social Determinants of Health   Financial Resource Strain: Not on file  Food Insecurity: Not on file  Transportation Needs: Not on file  Physical Activity: Not on file  Stress: Not on file  Social Connections: Not on file    No Known Allergies  Outpatient Medications Prior to Visit  Medication Sig Dispense Refill   amLODipine (NORVASC) 10 MG tablet Take 1 tablet (10  mg total) by mouth daily. 30 tablet 3   atorvastatin (LIPITOR) 20 MG tablet Take 1 tablet (20 mg total) by mouth daily. 30 tablet 3   blood glucose meter kit and supplies KIT Dispense based on patient and insurance preference. Use up to four times daily as directed. 1 each 0   blood glucose meter kit and supplies Dispense based on patient and insurance preference. Use up to four times daily as directed. (FOR ICD-10 E10.9, E11.9). 1 each 0   Blood Glucose Monitoring Suppl (TRUE METRIX METER) w/Device KIT Use as directed 3 (three) times daily before meals. 1 kit 0   Blood Pressure KIT 1 Units by Does not apply route daily. Please check blood pressure every morning and keep a log. 1 kit 0   empagliflozin (JARDIANCE) 10 MG TABS tablet Take 1 tablet (10 mg total) by mouth daily before breakfast. 30 tablet 3   glucose blood (TRUE METRIX BLOOD GLUCOSE TEST) test strip Use as directed 3 times daily 100 each 12   hydrALAZINE (APRESOLINE) 100 MG tablet Take 1 tablet (100 mg total) by mouth 3 (three) times daily. 90 tablet 3   lisinopril (ZESTRIL) 20 MG tablet Take 1 tablet (20 mg total) by mouth daily. 30 tablet 3   metFORMIN (GLUCOPHAGE) 500 MG tablet Take 1 tablet (500 mg total)  by mouth daily with breakfast. 30 tablet 3   TRUEplus Lancets 28G MISC Use as directed 3 (three) times daily before meals. 100 each 12   glipiZIDE (GLUCOTROL) 5 MG tablet TAKE 1 TABLET(5 MG) BY MOUTH TWICE DAILY BEFORE A MEAL 60 tablet 6   insulin glargine (LANTUS SOLOSTAR) 100 UNIT/ML Solostar Pen Inject 10 Units into the skin daily. 15 mL PRN   No facility-administered medications prior to visit.     ROS Review of Systems  Constitutional:  Negative for activity change and appetite change.  HENT:  Negative for sinus pressure and sore throat.   Respiratory:  Negative for chest tightness, shortness of breath and wheezing.   Cardiovascular:  Negative for chest pain and palpitations.  Gastrointestinal:  Positive for  constipation. Negative for abdominal distention and abdominal pain.  Genitourinary: Negative.   Musculoskeletal: Negative.   Psychiatric/Behavioral:  Negative for behavioral problems and dysphoric mood.     Objective:  BP (!) 160/85   Pulse 88   Ht 5\' 8"  (1.727 m)   Wt 171 lb 3.2 oz (77.7 kg)   SpO2 99%   BMI 26.03 kg/m      05/09/2022    9:24 AM 05/09/2022    8:41 AM 02/04/2022    9:03 AM  BP/Weight  Systolic BP 160 147 166  Diastolic BP 85 79 101  Wt. (Lbs)  171.2   BMI  26.03 kg/m2       Physical Exam Constitutional:      Appearance: He is well-developed.  Cardiovascular:     Rate and Rhythm: Normal rate.     Heart sounds: Normal heart sounds. No murmur heard. Pulmonary:     Effort: Pulmonary effort is normal.     Breath sounds: Normal breath sounds. No wheezing or rales.  Chest:     Chest wall: No tenderness.  Abdominal:     General: Bowel sounds are normal. There is no distension.     Palpations: Abdomen is soft. There is no mass.     Tenderness: There is no abdominal tenderness.  Musculoskeletal:        General: Normal range of motion.     Right lower leg: No edema.     Left lower leg: No edema.  Neurological:     Mental Status: He is alert and oriented to person, place, and time.  Psychiatric:        Mood and Affect: Mood normal.    Diabetic Foot Exam - Simple   Simple Foot Form Diabetic Foot exam was performed with the following findings: Yes 05/09/2022  9:18 AM  Visual Inspection No deformities, no ulcerations, no other skin breakdown bilaterally: Yes Sensation Testing Intact to touch and monofilament testing bilaterally: Yes Pulse Check Posterior Tibialis and Dorsalis pulse intact bilaterally: Yes Comments        Latest Ref Rng & Units 02/04/2022    9:07 AM 11/12/2021   10:25 AM 10/20/2021   11:04 AM  CMP  Glucose 70 - 99 mg/dL 97  161  096   BUN 8 - 27 mg/dL 17  13  17    Creatinine 0.76 - 1.27 mg/dL 0.45  4.09  8.11   Sodium 134 - 144  mmol/L 139  139  137   Potassium 3.5 - 5.2 mmol/L 4.8  4.4  5.4   Chloride 96 - 106 mmol/L 104  102  99   CO2 20 - 29 mmol/L 21  21  21    Calcium 8.6 - 10.2  mg/dL 9.6  9.8  40.910.0   Total Protein 6.0 - 8.5 g/dL 6.9  7.5  7.2   Total Bilirubin 0.0 - 1.2 mg/dL 0.3  0.5  0.8   Alkaline Phos 44 - 121 IU/L 48  49  60   AST 0 - 40 IU/L 31  51  37   ALT 0 - 44 IU/L 38  76  39     Lipid Panel     Component Value Date/Time   CHOL 122 02/04/2022 0907   TRIG 160 (H) 02/04/2022 0907   HDL 41 02/04/2022 0907   CHOLHDL 3.0 02/04/2022 0907   LDLCALC 54 02/04/2022 0907    CBC    Component Value Date/Time   WBC 5.4 02/04/2022 0907   WBC 5.4 05/13/2019 0637   RBC 3.55 (L) 02/04/2022 0907   RBC 2.99 (L) 05/13/2019 0637   HGB 11.3 (L) 02/04/2022 0907   HCT 35.3 (L) 02/04/2022 0907   PLT 229 02/04/2022 0907   MCV 99 (H) 02/04/2022 0907   MCH 31.8 02/04/2022 0907   MCH 32.1 05/13/2019 0637   MCHC 32.0 02/04/2022 0907   MCHC 33.1 05/13/2019 0637   RDW 13.7 02/04/2022 0907   LYMPHSABS 2.1 02/04/2022 0907   MONOABS 0.4 05/13/2019 0637   EOSABS 0.1 02/04/2022 0907   BASOSABS 0.0 02/04/2022 [id number]    Lab Results  Component Value Date   HGBA1C 7.6 (A) 05/09/2022    Assessment & Plan:  1. Type 2 diabetes mellitus with other specified complication, without long-term current use of insulin Commended on improvement in A1c A1c 7.6 which is slightly suboptimally controlled Continue to work on lifestyle Counseled on Diabetic diet, my plate method, 147150 minutes of moderate intensity exercise/week Blood sugar logs with fasting goals of 80-120 mg/dl, random of less than [id number] and in the event of sugars less than 60 mg/dl or greater than 562400 mg/dl encouraged to notify the clinic. Advised on the need for annual eye exams, annual foot exams, Pneumonia vaccine. - POCT glycosylated hemoglobin (Hb A1C) - Ambulatory referral to Ophthalmology - CMP14+EGFR - glipiZIDE (GLUCOTROL) 10 MG tablet; TAKE 1 TABLET(5  MG) BY MOUTH TWICE DAILY BEFORE A MEAL  Dispense: 180 tablet; Refill: 1  2. Other constipation Counseled on increasing fiber intake, fruits and vegetable, limit intake of foods like cheese, white bread, white rice - psyllium (METAMUCIL SMOOTH TEXTURE) 58.6 % powder; Take 1 packet by mouth 3 (three) times daily.  Dispense: 283 g; Refill: 12  3. Screening for colon cancer - Ambulatory referral to Gastroenterology  4. Hypertension associated with stage 3b chronic kidney disease due to type 2 diabetes mellitus Uncontrolled Chlorthalidone added to regimen meanwhile continue Norvasc, lisinopril, hydralazine. Counseled on blood pressure goal of less than 130/80, low-sodium, DASH diet, medication compliance, 150 minutes of moderate intensity exercise per week. Discussed medication compliance, adverse effects. - chlorthalidone (HYGROTON) 25 MG tablet; Take 1 tablet (25 mg total) by mouth daily.  Dispense: 90 tablet; Refill: 1   Meds ordered this encounter  Medications   psyllium (METAMUCIL SMOOTH TEXTURE) 58.6 % powder    Sig: Take 1 packet by mouth 3 (three) times daily.    Dispense:  283 g    Refill:  12   glipiZIDE (GLUCOTROL) 10 MG tablet    Sig: TAKE 1 TABLET(5 MG) BY MOUTH TWICE DAILY BEFORE A MEAL    Dispense:  180 tablet    Refill:  1    Discontinue metformin   chlorthalidone (HYGROTON) 25 MG  tablet    Sig: Take 1 tablet (25 mg total) by mouth daily.    Dispense:  90 tablet    Refill:  1    Follow-up: Return in about 3 months (around 08/08/2022) for Chronic medical conditions.       Hoy Register, MD, FAAFP. [facility] [place], [place] [phone]   05/09/2022, 3:23 PM

## 2022-05-10 LAB — CMP14+EGFR
ALT: 28 IU/L (ref 0–44)
AST: 19 IU/L (ref 0–40)
Albumin/Globulin Ratio: 1.7 (ref 1.2–2.2)
Albumin: 4.5 g/dL (ref 3.9–4.9)
Alkaline Phosphatase: 48 IU/L (ref 44–121)
BUN/Creatinine Ratio: 12 (ref 10–24)
BUN: 16 mg/dL (ref 8–27)
Bilirubin Total: 0.3 mg/dL (ref 0.0–1.2)
CO2: 19 mmol/L — ABNORMAL LOW (ref 20–29)
Calcium: 9.5 mg/dL (ref 8.6–10.2)
Chloride: 105 mmol/L (ref 96–106)
Creatinine, Ser: 1.35 mg/dL — ABNORMAL HIGH (ref 0.76–1.27)
Globulin, Total: 2.6 g/dL (ref 1.5–4.5)
Glucose: 160 mg/dL — ABNORMAL HIGH (ref 70–99)
Potassium: 3.7 mmol/L (ref 3.5–5.2)
Sodium: 140 mmol/L (ref 134–144)
Total Protein: 7.1 g/dL (ref 6.0–8.5)
eGFR: 59 mL/min/{1.73_m2} — ABNORMAL LOW (ref >59)

## 2022-08-08 ENCOUNTER — Other Ambulatory Visit: Payer: Self-pay

## 2022-08-08 ENCOUNTER — Ambulatory Visit: Payer: Medicaid Other | Attending: Family Medicine | Admitting: Family Medicine

## 2022-08-08 ENCOUNTER — Telehealth: Payer: Self-pay

## 2022-08-08 ENCOUNTER — Encounter: Payer: Self-pay | Admitting: Family Medicine

## 2022-08-08 VITALS — BP 153/88 | HR 109 | Ht 68.0 in | Wt 165.2 lb

## 2022-08-08 DIAGNOSIS — N1832 Chronic kidney disease, stage 3b: Secondary | ICD-10-CM

## 2022-08-08 DIAGNOSIS — Z7984 Long term (current) use of oral hypoglycemic drugs: Secondary | ICD-10-CM

## 2022-08-08 DIAGNOSIS — I129 Hypertensive chronic kidney disease with stage 1 through stage 4 chronic kidney disease, or unspecified chronic kidney disease: Secondary | ICD-10-CM

## 2022-08-08 DIAGNOSIS — E1169 Type 2 diabetes mellitus with other specified complication: Secondary | ICD-10-CM | POA: Diagnosis not present

## 2022-08-08 DIAGNOSIS — E1122 Type 2 diabetes mellitus with diabetic chronic kidney disease: Secondary | ICD-10-CM

## 2022-08-08 LAB — POCT GLYCOSYLATED HEMOGLOBIN (HGB A1C): HbA1c, POC (controlled diabetic range): 9 % — AB (ref 0.0–7.0)

## 2022-08-08 MED ORDER — EMPAGLIFLOZIN 10 MG PO TABS
10.0000 mg | ORAL_TABLET | Freq: Every day | ORAL | 3 refills | Status: AC
Start: 2022-08-08 — End: ?
  Filled 2022-08-08: qty 30, 30d supply, fill #0

## 2022-08-08 MED ORDER — CHLORTHALIDONE 25 MG PO TABS
25.0000 mg | ORAL_TABLET | Freq: Every day | ORAL | 1 refills | Status: AC
Start: 2022-08-08 — End: ?
  Filled 2022-08-08: qty 90, 90d supply, fill #0

## 2022-08-08 MED ORDER — HYDRALAZINE HCL 100 MG PO TABS
100.0000 mg | ORAL_TABLET | Freq: Three times a day (TID) | ORAL | 3 refills | Status: AC
  Filled 2022-08-08: qty 90, 30d supply, fill #0

## 2022-08-08 MED ORDER — AMLODIPINE BESYLATE 10 MG PO TABS
10.0000 mg | ORAL_TABLET | Freq: Every day | ORAL | 3 refills | Status: AC
Start: 2022-08-08 — End: ?
  Filled 2022-08-08: qty 30, 30d supply, fill #0

## 2022-08-08 MED ORDER — GLIPIZIDE 10 MG PO TABS
10.0000 mg | ORAL_TABLET | Freq: Two times a day (BID) | ORAL | 1 refills | Status: AC
Start: 2022-08-08 — End: ?
  Filled 2022-08-08: qty 180, 90d supply, fill #0

## 2022-08-08 MED ORDER — METFORMIN HCL 500 MG PO TABS
500.0000 mg | ORAL_TABLET | Freq: Two times a day (BID) | ORAL | 1 refills | Status: AC
Start: 2022-08-08 — End: ?
  Filled 2022-08-08: qty 180, 90d supply, fill #0

## 2022-08-08 MED ORDER — LISINOPRIL 30 MG PO TABS
30.0000 mg | ORAL_TABLET | Freq: Every day | ORAL | 1 refills | Status: AC
  Filled 2022-08-08: qty 90, 90d supply, fill #0

## 2022-08-08 MED ORDER — ATORVASTATIN CALCIUM 20 MG PO TABS
20.0000 mg | ORAL_TABLET | Freq: Every day | ORAL | 3 refills | Status: AC
  Filled 2022-08-08: qty 30, 30d supply, fill #0
  Filled 2022-10-07: qty 30, 30d supply, fill #1

## 2022-08-08 NOTE — Patient Instructions (Signed)
Please call for your colonoscopy appointment: Placed in [facility]  [address] [place], [place] [address] PH# [phone]

## 2022-08-08 NOTE — Telephone Encounter (Signed)
A prior authorization request for [place] Pepper has been submitted to insurance today via CoverMyMeds Key: Karle Starch

## 2022-08-08 NOTE — Progress Notes (Signed)
Subjective:  Patient ID: Brent Thompson, male    DOB: [birth year]  Age: 62 y.o. MRN: [id number]  CC: Diabetes   HPI Brent Thompson is a 62 y.o. year old male with a history of type 2 diabetes mellitus (A1c 9.0), hypertension, stage III chronic kidney disease, Nicotine dependence (1 pack /week x2 years)  who presents today for follow-up of his hypertension.   Interval History: Discussed the use of AI scribe software for clinical note transcription with the patient, who gave verbal consent to proceed.  He reports no new concerns. However his blood pressure was noted to be elevated during the visit. He admits to not taking his lisinopril today due to running out and also elevates elevated BP to frustration from waiting for the clinic to open. He also reports having to hold his urine, which he believes contributed to the elevated blood pressure.  Regarding his diabetes, his most recent A1c has increased from 7.6 to 9.0. He admits to running out of his [place] Pepper about a month ago and did not contact the clinic for a refill due to lack of time. He is currently taking metformin once daily and also on glipizide. Endorses adherence with his statin. Currently not under the care of nephrology for management of CKD.       Past Medical History:  Diagnosis Date   Diabetes mellitus without complication (HCC)    Hypertension     History reviewed. No pertinent surgical history.  History reviewed. No pertinent family history.  Social History   Socioeconomic History   Marital status: Single    Spouse name: Not on file   Number of children: Not on file   Years of education: Not on file   Highest education level: Not on file  Occupational History   Not on file  Tobacco Use   Smoking status: Every Day    Current packs/day: 1.00    Types: Cigarettes    Passive exposure: Current   Smokeless tobacco: Never  Substance and Sexual Activity   Alcohol use: Yes   Drug use: Yes    Types: Other-see  comments   Sexual activity: Not on file  Other Topics Concern   Not on file  Social History Narrative   Not on file   Social Determinants of Health   Financial Resource Strain: Not on file  Food Insecurity: Not on file  Transportation Needs: Not on file  Physical Activity: Not on file  Stress: Not on file  Social Connections: Unknown (07/20/2021)   Received from [facility], [facility]   Social Network    Social Network: Not on file    No Known Allergies  Outpatient Medications Prior to Visit  Medication Sig Dispense Refill   blood glucose meter kit and supplies KIT Dispense based on patient and insurance preference. Use up to four times daily as directed. 1 each 0   blood glucose meter kit and supplies Dispense based on patient and insurance preference. Use up to four times daily as directed. (FOR ICD-10 E10.9, E11.9). 1 each 0   Blood Glucose Monitoring Suppl (TRUE METRIX METER) w/Device KIT Use as directed 3 (three) times daily before meals. 1 kit 0   Blood Pressure KIT 1 Units by Does not apply route daily. Please check blood pressure every morning and keep a log. 1 kit 0   glucose blood (TRUE METRIX BLOOD GLUCOSE TEST) test strip Use as directed 3 times daily 100 each 12   psyllium (METAMUCIL SMOOTH TEXTURE) 58.6 %  powder Take 1 packet by mouth 3 (three) times daily. 283 g 12   TRUEplus Lancets 28G MISC Use as directed 3 (three) times daily before meals. 100 each 12   amLODipine (NORVASC) 10 MG tablet Take 1 tablet (10 mg total) by mouth daily. 30 tablet 3   atorvastatin (LIPITOR) 20 MG tablet Take 1 tablet (20 mg total) by mouth daily. 30 tablet 3   chlorthalidone (HYGROTON) 25 MG tablet Take 1 tablet (25 mg total) by mouth daily. 90 tablet 1   empagliflozin (JARDIANCE) 10 MG TABS tablet Take 1 tablet (10 mg total) by mouth daily before breakfast. 30 tablet 3   glipiZIDE (GLUCOTROL) 10 MG tablet TAKE 1 TABLET(5 MG) BY MOUTH TWICE DAILY BEFORE A MEAL 180 tablet 1    hydrALAZINE (APRESOLINE) 100 MG tablet Take 1 tablet (100 mg total) by mouth 3 (three) times daily. 90 tablet 3   lisinopril (ZESTRIL) 20 MG tablet Take 1 tablet (20 mg total) by mouth daily. 30 tablet 3   metFORMIN (GLUCOPHAGE) 500 MG tablet Take 1 tablet (500 mg total) by mouth daily with breakfast. 30 tablet 3   No facility-administered medications prior to visit.     ROS Review of Systems  Constitutional:  Negative for activity change and appetite change.  HENT:  Negative for sinus pressure and sore throat.   Respiratory:  Negative for chest tightness, shortness of breath and wheezing.   Cardiovascular:  Negative for chest pain and palpitations.  Gastrointestinal:  Negative for abdominal distention, abdominal pain and constipation.  Genitourinary: Negative.   Musculoskeletal: Negative.   Psychiatric/Behavioral:  Negative for behavioral problems and dysphoric mood.     Objective:  BP (!) 153/88   Pulse (!) 109   Ht 5\' 8"  (1.727 m)   Wt 165 lb 3.2 oz (74.9 kg)   SpO2 99%   BMI 25.12 kg/m      08/08/2022    8:46 AM 05/09/2022    9:24 AM 05/09/2022    8:41 AM  BP/Weight  Systolic BP 153 160 147  Diastolic BP 88 85 79  Wt. (Lbs) 165.2  171.2  BMI 25.12 kg/m2  26.03 kg/m2      Physical Exam Constitutional:      Appearance: He is well-developed.  Cardiovascular:     Rate and Rhythm: Normal rate.     Heart sounds: Normal heart sounds. No murmur heard. Pulmonary:     Effort: Pulmonary effort is normal.     Breath sounds: Normal breath sounds. No wheezing or rales.  Chest:     Chest wall: No tenderness.  Abdominal:     General: Bowel sounds are normal. There is no distension.     Palpations: Abdomen is soft. There is no mass.     Tenderness: There is no abdominal tenderness.  Musculoskeletal:        General: Normal range of motion.     Right lower leg: No edema.     Left lower leg: No edema.  Neurological:     Mental Status: He is alert and oriented to person,  place, and time.  Psychiatric:        Mood and Affect: Mood normal.       Latest Ref Rng & Units 05/09/2022    9:50 AM 02/04/2022    9:07 AM 11/12/2021   10:25 AM  CMP  Glucose 70 - 99 mg/dL 161  97  096   BUN 8 - 27 mg/dL 16  17  13  Creatinine 0.76 - 1.27 mg/dL 2.95  6.21  3.08   Sodium 134 - 144 mmol/L 140  139  139   Potassium 3.5 - 5.2 mmol/L 3.7  4.8  4.4   Chloride 96 - 106 mmol/L 105  104  102   CO2 20 - 29 mmol/L 19  21  21    Calcium 8.6 - 10.2 mg/dL 9.5  9.6  9.8   Total Protein 6.0 - 8.5 g/dL 7.1  6.9  7.5   Total Bilirubin 0.0 - 1.2 mg/dL 0.3  0.3  0.5   Alkaline Phos 44 - 121 IU/L 48  48  49   AST 0 - 40 IU/L 19  31  51   ALT 0 - 44 IU/L 28  38  76     Lipid Panel     Component Value Date/Time   CHOL 122 02/04/2022 0907   TRIG 160 (H) 02/04/2022 0907   HDL 41 02/04/2022 0907   CHOLHDL 3.0 02/04/2022 0907   LDLCALC 54 02/04/2022 0907    CBC    Component Value Date/Time   WBC 5.4 02/04/2022 0907   WBC 5.4 05/13/2019 0637   RBC 3.55 (L) 02/04/2022 0907   RBC 2.99 (L) 05/13/2019 0637   HGB 11.3 (L) 02/04/2022 0907   HCT 35.3 (L) 02/04/2022 0907   PLT 229 02/04/2022 0907   MCV 99 (H) 02/04/2022 0907   MCH 31.8 02/04/2022 0907   MCH 32.1 05/13/2019 0637   MCHC 32.0 02/04/2022 0907   MCHC 33.1 05/13/2019 0637   RDW 13.7 02/04/2022 0907   LYMPHSABS 2.1 02/04/2022 0907   MONOABS 0.4 05/13/2019 0637   EOSABS 0.1 02/04/2022 0907   BASOSABS 0.0 02/04/2022 0907    Lab Results  Component Value Date   HGBA1C 9.0 (A) 08/08/2022    Assessment & Plan:      Hypertension and stage IIIb kidney disease: Elevated blood pressure today, possibly due to missed Lisinopril dose. Previous readings also slightly high. -Recheck blood pressure is still elevated -Increase lisinopril from 20 mg to 30 mg and I will repeat his potassium at his next visit  Type 2 Diabetes Mellitus: A1c increased from 7.6 to 9.0, likely due to patient running out of Jardiance for  approximately one month. Currently on Metformin 500mg  once daily. -Increase Metformin to 500mg  twice daily. -Refill Jardiance and Glipizide prescriptions. -Recheck A1c in three months. -Counseled on Diabetic diet, my plate method, 657 minutes of moderate intensity exercise/week Blood sugar logs with fasting goals of 80-120 mg/dl, random of less than 846 and in the event of sugars less than 60 mg/dl or greater than 962 mg/dl encouraged to notify the clinic. Advised on the need for annual eye exams, annual foot exams, Pneumonia vaccine. -Referred to liberate study  Hyperlipidemia: Patient ran out of cholesterol medication. -Refill atorvastatin Low-cholesterol diet  Stage IIIb CKD: History of referral to nephrologist, but patient did not attend. -Check renal function via blood test today. -Will refer back to nephrology if renal function is still abnormal but she currently has stage IIIb kidney disease  General Health Maintenance: -Encourage patient to promptly refill medications and not to go without them, due to potential impact on renal function and overall health.          Meds ordered this encounter  Medications   metFORMIN (GLUCOPHAGE) 500 MG tablet    Sig: Take 1 tablet (500 mg total) by mouth 2 (two) times daily with a meal.    Dispense:  180  tablet    Refill:  1    Dose increase   lisinopril (ZESTRIL) 30 MG tablet    Sig: Take 1 tablet (30 mg total) by mouth daily.    Dispense:  90 tablet    Refill:  1    Dose increase   amLODipine (NORVASC) 10 MG tablet    Sig: Take 1 tablet (10 mg total) by mouth daily.    Dispense:  30 tablet    Refill:  3   atorvastatin (LIPITOR) 20 MG tablet    Sig: Take 1 tablet (20 mg total) by mouth daily.    Dispense:  30 tablet    Refill:  3   chlorthalidone (HYGROTON) 25 MG tablet    Sig: Take 1 tablet (25 mg total) by mouth daily.    Dispense:  90 tablet    Refill:  1   empagliflozin (JARDIANCE) 10 MG TABS tablet    Sig: Take 1  tablet (10 mg total) by mouth daily before breakfast.    Dispense:  30 tablet    Refill:  3   glipiZIDE (GLUCOTROL) 10 MG tablet    Sig: Take 1 tablet (10 mg total) by mouth 2 (two) times daily before a meal.    Dispense:  180 tablet    Refill:  1    Discontinue metformin   hydrALAZINE (APRESOLINE) 100 MG tablet    Sig: Take 1 tablet (100 mg total) by mouth 3 (three) times daily.    Dispense:  90 tablet    Refill:  3    Dose increase    Follow-up: Return in about 2 weeks (around 08/22/2022) for Blood Pressure follow-up, ok to double book.       Hoy Register, MD, FAAFP. [facility] [place], [place] [phone]   08/08/2022, 9:10 AM

## 2022-08-09 ENCOUNTER — Other Ambulatory Visit: Payer: Self-pay

## 2022-08-09 ENCOUNTER — Other Ambulatory Visit: Payer: Self-pay | Admitting: Family Medicine

## 2022-08-09 DIAGNOSIS — N1832 Chronic kidney disease, stage 3b: Secondary | ICD-10-CM

## 2022-08-09 LAB — CMP14+EGFR
ALT: 25 IU/L (ref 0–44)
AST: 28 IU/L (ref 0–40)
Albumin: 4.7 g/dL (ref 3.9–4.9)
Alkaline Phosphatase: 61 IU/L (ref 44–121)
BUN/Creatinine Ratio: 11 (ref 10–24)
BUN: 19 mg/dL (ref 8–27)
Bilirubin Total: 0.3 mg/dL (ref 0.0–1.2)
CO2: 19 mmol/L — ABNORMAL LOW (ref 20–29)
Calcium: 10.3 mg/dL — ABNORMAL HIGH (ref 8.6–10.2)
Chloride: 103 mmol/L (ref 96–106)
Creatinine, Ser: 1.8 mg/dL — ABNORMAL HIGH (ref 0.76–1.27)
Globulin, Total: 2.7 g/dL (ref 1.5–4.5)
Glucose: 222 mg/dL — ABNORMAL HIGH (ref 70–99)
Potassium: 4.6 mmol/L (ref 3.5–5.2)
Sodium: 138 mmol/L (ref 134–144)
Total Protein: 7.4 g/dL (ref 6.0–8.5)
eGFR: 42 mL/min/{1.73_m2} — ABNORMAL LOW (ref >59)

## 2022-08-12 ENCOUNTER — Telehealth: Payer: Self-pay | Admitting: Pharmacist

## 2022-08-12 NOTE — Telephone Encounter (Signed)
 LIBERATE Study  Received referral for patient participation in the LIBERATE CGM Study. Contacted patient to discuss study and confirmed HIPAA identifiers. Confirmed patient was provided the LIBERATE Study Information Sheet and any questions were answered.   Confirmed that patient meets study criteria by having a diagnosis of Type 2 Diabetes, is not currently on insulin, and most recent A1c is >8%.  Patient declined to provide verbal consent to participate in the study.  Butch Penny, PharmD, [place], CPP Clinical Pharmacist [facility] [phone]

## 2022-08-15 ENCOUNTER — Other Ambulatory Visit: Payer: Self-pay

## 2022-08-26 ENCOUNTER — Ambulatory Visit: Payer: Medicaid Other | Admitting: Family Medicine

## 2022-10-07 ENCOUNTER — Other Ambulatory Visit: Payer: Self-pay

## 2023-06-14 ENCOUNTER — Encounter (HOSPITAL_BASED_OUTPATIENT_CLINIC_OR_DEPARTMENT_OTHER): Payer: Self-pay | Admitting: Emergency Medicine

## 2023-06-14 ENCOUNTER — Emergency Department (HOSPITAL_BASED_OUTPATIENT_CLINIC_OR_DEPARTMENT_OTHER)
Admission: EM | Admit: 2023-06-14 | Discharge: 2023-06-14 | Disposition: A | Discharge status: Home / Self Care | Attending: Emergency Medicine | Admitting: Emergency Medicine

## 2023-06-14 DIAGNOSIS — Z79899 Other long term (current) drug therapy: Secondary | ICD-10-CM | POA: Diagnosis not present

## 2023-06-14 DIAGNOSIS — E119 Type 2 diabetes mellitus without complications: Secondary | ICD-10-CM | POA: Insufficient documentation

## 2023-06-14 DIAGNOSIS — I10 Essential (primary) hypertension: Secondary | ICD-10-CM | POA: Diagnosis not present

## 2023-06-14 DIAGNOSIS — R778 Other specified abnormalities of plasma proteins: Secondary | ICD-10-CM | POA: Diagnosis not present

## 2023-06-14 DIAGNOSIS — R197 Diarrhea, unspecified: Secondary | ICD-10-CM | POA: Insufficient documentation

## 2023-06-14 DIAGNOSIS — Z7984 Long term (current) use of oral hypoglycemic drugs: Secondary | ICD-10-CM | POA: Insufficient documentation

## 2023-06-14 DIAGNOSIS — R112 Nausea with vomiting, unspecified: Secondary | ICD-10-CM | POA: Diagnosis present

## 2023-06-14 LAB — COMPREHENSIVE METABOLIC PANEL WITH GFR
ALT: 36 U/L (ref 0–44)
AST: 33 U/L (ref 15–41)
Albumin: 4.8 g/dL (ref 3.5–5.0)
Alkaline Phosphatase: 41 U/L (ref 38–126)
Anion gap: 16 — ABNORMAL HIGH (ref 5–15)
BUN: 21 mg/dL (ref 8–23)
CO2: 22 mmol/L (ref 22–32)
Calcium: 9.7 mg/dL (ref 8.9–10.3)
Chloride: 105 mmol/L (ref 98–111)
Creatinine, Ser: 1.67 mg/dL — ABNORMAL HIGH (ref 0.61–1.24)
GFR, Estimated: 45 mL/min — ABNORMAL LOW (ref >60)
Glucose, Bld: 135 mg/dL — ABNORMAL HIGH (ref 70–99)
Potassium: 4.5 mmol/L (ref 3.5–5.1)
Sodium: 143 mmol/L (ref 135–145)
Total Bilirubin: 0.3 mg/dL (ref 0.0–1.2)
Total Protein: 7.6 g/dL (ref 6.5–8.1)

## 2023-06-14 LAB — CBC WITH DIFFERENTIAL/PLATELET
Abs Immature Granulocytes: 0.03 10*3/uL (ref 0.00–0.07)
Basophils Absolute: 0 10*3/uL (ref 0.0–0.1)
Basophils Relative: 1 %
Eosinophils Absolute: 0 10*3/uL (ref 0.0–0.5)
Eosinophils Relative: 0 %
HCT: 34.8 % — ABNORMAL LOW (ref 39.0–52.0)
Hemoglobin: 11.7 g/dL — ABNORMAL LOW (ref 13.0–17.0)
Immature Granulocytes: 0 %
Lymphocytes Relative: 19 %
Lymphs Abs: 1.5 10*3/uL (ref 0.7–4.0)
MCH: 32.2 pg (ref 26.0–34.0)
MCHC: 33.6 g/dL (ref 30.0–36.0)
MCV: 95.9 fL (ref 80.0–100.0)
Monocytes Absolute: 0.5 10*3/uL (ref 0.1–1.0)
Monocytes Relative: 7 %
Neutro Abs: 5.5 10*3/uL (ref 1.7–7.7)
Neutrophils Relative %: 73 %
Platelets: 223 10*3/uL (ref 150–400)
RBC: 3.63 MIL/uL — ABNORMAL LOW (ref 4.22–5.81)
RDW: 14.1 % (ref 11.5–15.5)
WBC: 7.6 10*3/uL (ref 4.0–10.5)
nRBC: 0 % (ref 0.0–0.2)

## 2023-06-14 LAB — URINALYSIS, ROUTINE W REFLEX MICROSCOPIC
Bilirubin Urine: NEGATIVE
Glucose, UA: 100 mg/dL — AB
Hgb urine dipstick: NEGATIVE
Ketones, ur: NEGATIVE mg/dL
Leukocytes,Ua: NEGATIVE
Nitrite: NEGATIVE
Protein, ur: 30 mg/dL — AB
Specific Gravity, Urine: 1.025 (ref 1.005–1.030)
pH: 5.5 (ref 5.0–8.0)

## 2023-06-14 LAB — LIPASE, BLOOD: Lipase: 20 U/L (ref 11–51)

## 2023-06-14 LAB — URINALYSIS, MICROSCOPIC (REFLEX)

## 2023-06-14 LAB — CBG MONITORING, ED: Glucose-Capillary: 135 mg/dL — ABNORMAL HIGH (ref 70–99)

## 2023-06-14 LAB — TROPONIN T, HIGH SENSITIVITY
Troponin T High Sensitivity: 27 ng/L — ABNORMAL HIGH (ref <19)
Troponin T High Sensitivity: 24 ng/L — ABNORMAL HIGH (ref <19)

## 2023-06-14 MED ORDER — ONDANSETRON HCL 4 MG/2ML IJ SOLN
4.0000 mg | Freq: Once | INTRAMUSCULAR | Status: AC
  Administered 2023-06-14: 4 mg via INTRAVENOUS
  Filled 2023-06-14: qty 2

## 2023-06-14 MED ORDER — LABETALOL HCL 5 MG/ML IV SOLN
10.0000 mg | Freq: Once | INTRAVENOUS | Status: AC
  Administered 2023-06-14: 10 mg via INTRAVENOUS
  Filled 2023-06-14: qty 4

## 2023-06-14 MED ORDER — AMLODIPINE BESYLATE 5 MG PO TABS
10.0000 mg | ORAL_TABLET | Freq: Once | ORAL | Status: AC
  Administered 2023-06-14: 10 mg via ORAL
  Filled 2023-06-14: qty 2

## 2023-06-14 MED ORDER — ONDANSETRON 4 MG PO TBDP: 4.0000 mg | ORAL_TABLET | Freq: Three times a day (TID) | ORAL | 0 refills | Status: AC | PRN

## 2023-06-14 MED ORDER — LACTATED RINGERS IV BOLUS
1000.0000 mL | Freq: Once | INTRAVENOUS | Status: AC
  Administered 2023-06-14: 1000 mL via INTRAVENOUS

## 2023-06-14 NOTE — ED Triage Notes (Signed)
 Pt c/o NV since last night; c/o pain in both "sides" last night, denies at present; sts he gets like this when his blood sugar is high; doesn't check glucose at home

## 2023-06-14 NOTE — ED Notes (Signed)
 ED Provider at bedside.

## 2023-06-14 NOTE — ED Provider Notes (Signed)
 [place] [facility] Provider Note   CSN: [id number] Arrival date & time: 06/14/23  1109     History  Chief Complaint  Patient presents with   Emesis    Brent Thompson is a 63 y.o. male.  HPI       Nausea and vomiting since last night around 7PM Vomited at least 15 times Diarrhea x 10, no black or bloody stools Cramping abdominal pain like going to vomit, diffuse No chest pain or dyspnea, no dysuria Glucose at home ok Not able to keep down bp meds A long time ago had nausea and vomitin glike this when glucose was up Denies etoh, other druguse    Past Medical History:  Diagnosis Date   Diabetes mellitus without complication (HCC)    Hypertension     Home Medications Prior to Admission medications   Medication Sig Start Date End Date Taking? Authorizing Provider  ondansetron  (ZOFRAN -ODT) 4 MG disintegrating tablet Take 1 tablet (4 mg total) by mouth every 8 (eight) hours as needed for nausea or vomiting. 06/14/23  Yes Scarlette Currier, MD  amLODipine  (NORVASC ) 10 MG tablet Take 1 tablet (10 mg total) by mouth daily. 08/08/22   Newlin, Enobong, MD  atorvastatin  (LIPITOR) 20 MG tablet Take 1 tablet (20 mg total) by mouth daily. 08/08/22   Newlin, Enobong, MD  blood glucose meter kit and supplies KIT Dispense based on patient and insurance preference. Use up to four times daily as directed. 10/20/21   Lora Robinsons, FNP  blood glucose meter kit and supplies Dispense based on patient and insurance preference. Use up to four times daily as directed. (FOR ICD-10 E10.9, E11.9). 05/13/19   Arrien, Curlee Doss, MD  Blood Glucose Monitoring Suppl (TRUE METRIX METER) w/Device KIT Use as directed 3 (three) times daily before meals. 11/12/21   Newlin, Enobong, MD  Blood Pressure KIT 1 Units by Does not apply route daily. Please check blood pressure every morning and keep a log. 05/13/19   Arrien, Curlee Doss, MD  chlorthalidone  (HYGROTON ) 25  MG tablet Take 1 tablet (25 mg total) by mouth daily. 08/08/22   Newlin, Enobong, MD  empagliflozin  (JARDIANCE ) 10 MG TABS tablet Take 1 tablet (10 mg total) by mouth daily before breakfast. 08/08/22   Newlin, Enobong, MD  glipiZIDE  (GLUCOTROL ) 10 MG tablet Take 1 tablet (10 mg total) by mouth 2 (two) times daily before a meal. 08/08/22   Joaquin Mulberry, MD  glucose blood (TRUE METRIX BLOOD GLUCOSE TEST) test strip Use as directed 3 times daily 11/12/21   Newlin, Enobong, MD  hydrALAZINE  (APRESOLINE ) 100 MG tablet Take 1 tablet (100 mg total) by mouth 3 (three) times daily. 08/08/22   Newlin, Enobong, MD  lisinopril  (ZESTRIL ) 30 MG tablet Take 1 tablet (30 mg total) by mouth daily. 08/08/22   Newlin, Enobong, MD  metFORMIN  (GLUCOPHAGE ) 500 MG tablet Take 1 tablet (500 mg total) by mouth 2 (two) times daily with a meal. 08/08/22   Newlin, Enobong, MD  psyllium (METAMUCIL SMOOTH TEXTURE) 58.6 % powder Take 1 packet by mouth 3 (three) times daily. 05/09/22   Newlin, Enobong, MD  TRUEplus Lancets 28G MISC Use as directed 3 (three) times daily before meals. 11/12/21   Newlin, Enobong, MD      Allergies    Patient has no known allergies.    Review of Systems   Review of Systems  Physical Exam Updated Vital Signs BP (!) 178/99   Pulse 85   Temp  99 F (37.2 C) (Oral)   Resp 18   Ht 5\' 8"  (1.727 m)   Wt 68 kg   SpO2 98%   BMI 22.81 kg/m  Physical Exam Vitals and nursing note reviewed.  Constitutional:      General: He is not in acute distress.    Appearance: He is well-developed. He is not diaphoretic.  HENT:     Head: Normocephalic and atraumatic.  Eyes:     Conjunctiva/sclera: Conjunctivae normal.  Cardiovascular:     Rate and Rhythm: Normal rate and regular rhythm.     Heart sounds: Normal heart sounds. No murmur heard.    No friction rub. No gallop.  Pulmonary:     Effort: Pulmonary effort is normal. No respiratory distress.     Breath sounds: Normal breath sounds. No wheezing or  rales.  Abdominal:     General: There is no distension.     Palpations: Abdomen is soft.     Tenderness: There is no abdominal tenderness. There is no guarding.  Musculoskeletal:     Cervical back: Normal range of motion.  Skin:    General: Skin is warm and dry.  Neurological:     Mental Status: He is alert and oriented to person, place, and time.     ED Results / Procedures / Treatments   Labs (all labs ordered are listed, but only abnormal results are displayed) Labs Reviewed  COMPREHENSIVE METABOLIC PANEL WITH GFR - Abnormal; Notable for the following components:      Result Value   Glucose, Bld 135 (*)    Creatinine, Ser 1.67 (*)    GFR, Estimated 45 (*)    Anion gap 16 (*)    All other components within normal limits  URINALYSIS, ROUTINE W REFLEX MICROSCOPIC - Abnormal; Notable for the following components:   Glucose, UA 100 (*)    Protein, ur 30 (*)    All other components within normal limits  CBC WITH DIFFERENTIAL/PLATELET - Abnormal; Notable for the following components:   RBC 3.63 (*)    Hemoglobin 11.7 (*)    HCT 34.8 (*)    All other components within normal limits  URINALYSIS, MICROSCOPIC (REFLEX) - Abnormal; Notable for the following components:   Bacteria, UA FEW (*)    All other components within normal limits  CBG MONITORING, ED - Abnormal; Notable for the following components:   Glucose-Capillary 135 (*)    All other components within normal limits  TROPONIN T, HIGH SENSITIVITY - Abnormal; Notable for the following components:   Troponin T High Sensitivity 27 (*)    All other components within normal limits  TROPONIN T, HIGH SENSITIVITY - Abnormal; Notable for the following components:   Troponin T High Sensitivity 24 (*)    All other components within normal limits  LIPASE, BLOOD    EKG EKG Interpretation Date/Time:  Saturday Jun 14 2023 12:09:27 EDT Ventricular Rate:  94 PR Interval:  139 QRS Duration:  101 QT Interval:  374 QTC  Calculation: 468 R Axis:   15  Text Interpretation: Sinus rhythm Probable left atrial enlargement Minimal ST elevation, anterior leads No significant change since last tracing Confirmed by Scarlette Currier ([id number]) on 06/14/2023 2:31:42 PM  Radiology No results found.  Procedures Procedures    Medications Ordered in ED Medications  lactated ringers  bolus 1,000 mL (0 mLs Intravenous Stopped 06/14/23 1415)  ondansetron  (ZOFRAN ) injection 4 mg (4 mg Intravenous Given 06/14/23 1309)  labetalol (NORMODYNE) injection 10 mg (10 mg  Intravenous Given 06/14/23 1308)  amLODipine  (NORVASC ) tablet 10 mg (10 mg Oral Given 06/14/23 1516)    ED Course/ Medical Decision Making/ A&P                                   63yo male with history of DM, hypertension presents with concern for nausea, vomiting, diarrhea.   DDx includes DKA, SBO, HHS, gastroenteritis, gastroparesis, colitis, electrolyte abnormailty, adnemia, ACS, medication effect, withdrawal, diverticulitis, colitis.  EKG completed given nausea, shows no acute abnormalities.   Labs completed and evaluated by me show no sign of pancareatitis, no hepatitis, no clinically significant abnormality, no sign of DKA.  UA without ketones or infection.  Troponin mildly elevated likely in the setting of hypertension, being unable to take home pain medications.  Will plan to repeat to ensure not increasing.    Abdominal exam benign, low suspicion for diverticulitis, appendecitis, cholecystitis, SBO, AAA, nephrolithiasis.  Suspect likely viral gastroenteritis. Feels improved after IV fluids, zofran  and is tolerating po.  Given rx for zofran , discussed can use imodium prn for severe diarrhea as low clinical suspicion for c difficile at this time.  Plan to dc if second troponin stable. Follow up with PCP.         Final Clinical Impression(s) / ED Diagnoses Final diagnoses:  Nausea vomiting and diarrhea    Rx / DC Orders ED Discharge Orders           Ordered    ondansetron  (ZOFRAN -ODT) 4 MG disintegrating tablet  Every 8 hours PRN        06/14/23 1503              Scarlette Currier, MD 06/14/23 2356
